# Patient Record
Sex: Male | Born: 1985 | Race: White | Hispanic: No | Marital: Single | Smoking: Current every day smoker
Health system: Southern US, Community
[De-identification: ages and names within clinical notes are randomized; demographics above are authoritative.]

## PROBLEM LIST (undated history)

## (undated) DIAGNOSIS — J45909 Unspecified asthma, uncomplicated: Secondary | ICD-10-CM

## (undated) DIAGNOSIS — F419 Anxiety disorder, unspecified: Secondary | ICD-10-CM

## (undated) DIAGNOSIS — J449 Chronic obstructive pulmonary disease, unspecified: Secondary | ICD-10-CM

## (undated) HISTORY — PX: APPENDECTOMY: SHX54

## (undated) HISTORY — PX: TONSILLECTOMY: SUR1361

## (undated) HISTORY — PX: FEMUR FRACTURE SURGERY: SHX633

---
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
## 2005-05-14 ENCOUNTER — Emergency Department: Payer: Self-pay | Admitting: Emergency Medicine

## 2005-06-25 ENCOUNTER — Emergency Department: Payer: Self-pay | Admitting: Emergency Medicine

## 2006-02-07 ENCOUNTER — Emergency Department (HOSPITAL_COMMUNITY): Admission: EM | Admit: 2006-02-07 | Discharge: 2006-02-08 | Payer: Self-pay | Admitting: Emergency Medicine

## 2006-05-13 ENCOUNTER — Emergency Department (HOSPITAL_COMMUNITY): Admission: EM | Admit: 2006-05-13 | Discharge: 2006-05-13 | Payer: Self-pay | Admitting: Emergency Medicine

## 2006-06-28 ENCOUNTER — Emergency Department: Payer: Self-pay | Admitting: Emergency Medicine

## 2006-07-17 ENCOUNTER — Emergency Department: Payer: Self-pay | Admitting: Unknown Physician Specialty

## 2006-09-06 ENCOUNTER — Emergency Department (HOSPITAL_COMMUNITY): Admission: EM | Admit: 2006-09-06 | Discharge: 2006-09-06 | Payer: Self-pay | Admitting: Family Medicine

## 2007-02-08 ENCOUNTER — Emergency Department (HOSPITAL_COMMUNITY): Admission: EM | Admit: 2007-02-08 | Discharge: 2007-02-08 | Payer: Self-pay | Admitting: Emergency Medicine

## 2007-03-14 ENCOUNTER — Emergency Department (HOSPITAL_COMMUNITY): Admission: EM | Admit: 2007-03-14 | Discharge: 2007-03-15 | Payer: Self-pay | Admitting: Emergency Medicine

## 2007-11-30 ENCOUNTER — Emergency Department: Payer: Self-pay | Admitting: Emergency Medicine

## 2008-05-26 ENCOUNTER — Emergency Department (HOSPITAL_COMMUNITY): Admission: EM | Admit: 2008-05-26 | Discharge: 2008-05-26 | Payer: Self-pay | Admitting: Emergency Medicine

## 2009-01-10 ENCOUNTER — Emergency Department: Payer: Self-pay | Admitting: Emergency Medicine

## 2009-02-16 ENCOUNTER — Emergency Department (HOSPITAL_COMMUNITY): Admission: EM | Admit: 2009-02-16 | Discharge: 2009-02-17 | Payer: Self-pay | Admitting: Emergency Medicine

## 2009-12-21 ENCOUNTER — Emergency Department (HOSPITAL_COMMUNITY): Admission: EM | Admit: 2009-12-21 | Discharge: 2009-12-21 | Payer: Self-pay | Admitting: Emergency Medicine

## 2010-04-22 ENCOUNTER — Emergency Department (HOSPITAL_COMMUNITY): Admission: EM | Admit: 2010-04-22 | Discharge: 2010-04-22 | Payer: Self-pay | Admitting: Family Medicine

## 2010-09-02 ENCOUNTER — Other Ambulatory Visit (HOSPITAL_COMMUNITY): Payer: Self-pay | Admitting: Family Medicine

## 2010-09-02 ENCOUNTER — Encounter (INDEPENDENT_AMBULATORY_CARE_PROVIDER_SITE_OTHER): Payer: Self-pay | Admitting: Family Medicine

## 2010-09-02 DIAGNOSIS — R52 Pain, unspecified: Secondary | ICD-10-CM

## 2010-09-02 DIAGNOSIS — R609 Edema, unspecified: Secondary | ICD-10-CM

## 2010-09-02 LAB — CONVERTED CEMR LAB
AST: 33 units/L (ref 0–37)
Alkaline Phosphatase: 69 units/L (ref 39–117)
BUN: 9 mg/dL (ref 6–23)
Calcium: 10 mg/dL (ref 8.4–10.5)
Chloride: 102 meq/L (ref 96–112)
Creatinine, Ser: 0.74 mg/dL (ref 0.40–1.50)
TSH: 1.726 microintl units/mL (ref 0.350–4.500)
Total Bilirubin: 0.6 mg/dL (ref 0.3–1.2)

## 2010-09-03 ENCOUNTER — Other Ambulatory Visit (HOSPITAL_COMMUNITY): Payer: Self-pay

## 2010-09-30 ENCOUNTER — Other Ambulatory Visit (HOSPITAL_COMMUNITY): Payer: Self-pay

## 2010-10-13 LAB — URINALYSIS, ROUTINE W REFLEX MICROSCOPIC
Ketones, ur: NEGATIVE mg/dL
Nitrite: NEGATIVE
pH: 6 (ref 5.0–8.0)

## 2010-12-12 NOTE — Op Note (Signed)
[place]. [facility]  Patient:    Roy Wilson, Roy Wilson                      MRN: [id number] Proc. Date: 02/28/01 Adm. Date:  [id number] Attending:  Carlena Sax CC:         Elvina Sidle, M.D.  Caryl Comes. Slotnick, M.D.   Operative Report  PREOPERATIVE DIAGNOSIS:  Obstructive sleep apnea with tonsillar hypertrophy.  POSTOPERATIVE DIAGNOSIS:  Obstructive sleep apnea with tonsillar hypertrophy.  OPERATION PERFORMED:  Tonsillectomy using Harmonic scalpel.  SURGEON:  Veverly Fells. Arletha Grippe, M.D.  ANESTHESIA:  General endotracheal.  INDICATIONS FOR PROCEDURE:  The patient is a 25 year old white male who has a long history of loud snoring at night and documented breath holding episodes and daytime somnolence.  Physical examination did show 3+ enlarged tonsils symmetrically enlarged.  Inpatient polysomnogram to rule out obstructive sleep apnea obtained on January 19, 2001 did reveal mild obstructive sleep apnea with an RDI of 16 events per hour desaturations down to 83%.  Based on his history and physical and findings on the sleep test, I have recommended proceeding with the above noted surgical procedure.  I have discussed extensively with him and his family the risks and benefits of the surgery including risks of general anesthesia, infection, bleeding, normal recovery period to be expected after this type of surgery.  I have entertained any questions and answered them appropriately.  Informed consent has been obtained and the patient presents for the above noted procedure.  OPERATIVE FINDINGS:  Bilaterally enlarged tonsils.  DESCRIPTION OF PROCEDURE:  The patient was brought to the operating room and placed in supine position.  General endotracheal anesthesia was administered via the anesthesiologist without complications.  The patient was administered 1 gm of Ancef IV x 1 and 60 mg of Decadron IV x 1.  The head of the table was turned 90 degrees.  The  patients face was draped in standard fashion.  The Crowe-Davis mouth retractor was inserted into the oral cavity.  This was used to retract the mouth open.  First attention was turned to the right tonsil.  A curved Allis clamp was used to grasp the tonsil and retract it medially. Harmonic scalpel was then used to dissect the tonsil free from the tonsillar fossa.  Bleeding was controlled with a combination of harmonic scalpel and suction cautery without difficulty.  Tonsil was then transected at the tongue base, removed through the oral cavity without incident.  Next, attention was turned to the left tonsil.  Identical procedure was carried out on this side compared to the right side with identical results.  After this was done, bleeding from both tonsillar fossas controlled meticulously with suction cautery without difficulty.  Both tonsillar fossas were then irrigated and suctioned dry.  There was no evidence of any active bleeding.  A red rubber catheter was placed through the left nares and brought out through the oral cavity.  This was used to retract the soft palate.  Indirect mirror examination of the nasopharynx did not show any significant adenoid hypertrophy.  Therefore an adenoidectomy was not performed.  The red rubber catheter was released through the nasal chamber and removed without difficulty.  An orogastric tube was placed.  This was used to decompress the stomach contents.  It was then removed without incident.  A total of 3 cc of a 0.5% Marcaine solution with 1:200,000 epinephrine was infiltrated into the anterior tonsillar pillar and tongue base bilaterally.  The Crowe-Davis mouth retractor was released and brought out through the oral cavity without incident.  FLUID GIVEN DURING PROCEDURE:  Approximately 800 cc of crystalloid.  ESTIMATED BLOOD LOSS:  Less than 30 cc.  URINE OUTPUT:  Not measured.  There were no drains, no  packs.  SPECIMENS:  Tonsils x 2.  The  patient tolerated the procedure well without complications, was extubated in the operating room and transferred to recovery in stable condition. Sponge, needle and instrument counts were correct at the end of the procedure. Total duration of the procedure was approximately one hour.  The patient will be admitted for overnight recovery.  Once he has recovered well, he will be sent home on  February 28, 2001.  He will be sent home on amoxicillin elixir 250 mg p.o. t.i.d. for 10 days, Lortab elixir 250 cc with two refills one to three teaspoons p.o. q.4h. p.r.n. pain.  He is to have light activity, post tonsillectomy diet for two weeks after surgery.  Both he and his family were given oral and written instructions.  They are to call for any problems with bleeding, fever, vomiting, pain, reaction to medications or any other questions.  He will follow up in the office for postoperative check Tuesday August 27 at 4 oclock p.m. DD:  02/28/01 TD:  02/28/01 Job: [id number]

## 2010-12-12 NOTE — Op Note (Signed)
[place]. [facility]  Patient:    Roy Wilson, Roy Wilson                      MRN: [id number] Adm. Date:  [id number] Attending:  Merrie Roof                           Operative Report  PREOPERATIVE DIAGNOSES: 1. Left postoperative tonsillectomy hemorrhage. 2. Psychogenic seizure disorder.  POSTOPERATIVE DIAGNOSES: 1. Left postoperative tonsillectomy hemorrhage. 2. Psychogenic seizure disorder.  PROCEDURE:  Electrocautery control of left fossa postoperative tonsillectomy hemorrhage.  SURGEON:  Carolan Shiver, M.D.  ANESTHESIA:  General endotracheal, Edwin Cap. Zoila Shutter, M.D.  COMPLICATIONS:  None.  INDICATIONS FOR PROCEDURE:  Roy Wilson is a 25 year old white male who originally underwent a tonsillectomy approximately one week ago by Dr. Carlena Sax.  Postoperatively the patient had a psychogenic seizure.  He was well until the evening of March 07, 2001, when at approximately 10 p.m. he spontaneously hemorrhaged from his left tonsillar fossa.  His mother called me from their car as they were en route to [facility] Emergency Room.  He was met in the emergency room and evaluated by myself.  He was found to have a large clot filling the left tonsillar fossa, and he was spitting out bright red blood. He had stable vital signs, and there was no bleeding from the right side.  He was awake, alert, and his mother was present in the emergency room.  He was diagnosed as having a spontaneous left postop tonsillectomy hemorrhage and was recommended for transfer immediately to the operating room for electrocautery control of the postoperative tonsillectomy hemorrhage.  Risks and complications of the procedure were explained to the mother, questions were invited and answered, and informed consent was signed.  JUSTIFICATION FOR PROCEDURE:  Patients age and need for general endotracheal anesthesia.  JUSTIFICATION FOR OVERNIGHT STAY: 1. Twenty-three hours of  observation to rule out further postoperative    tonsillectomy hemorrhage. 2. Patient has a history of psychogenic seizures.  DESCRIPTION OF PROCEDURE:  After the patient was taken to the operating room, he was placed in a supine position.  An IV had been begun in the emergency room.  A crash induction-type procedure was performed by Dr. Zoila Shutter with cricoid pressure.  The patient had eaten one bite of bread and drunk a half cup of tea at 10 p.m., approximately 1 hour 10 minutes prior to the induction. The induction was quite smooth.  The patient was intubated without difficulty or any aspiration.  The endotracheal cuff immediately inflated.  Throat pack was placed.  The patient was turned 90 degrees and placed in the Rose position, head drapes applied, and the Crowe-Davis mouth gag was inserted, followed by the moistened throat pack as previously mentioned.  Examination of his oropharynx revealed a large clot filling the left tonsillar fossa.  The clot was removed, and the patient was found to be bleeding from two large arteries in the left superior pole.  The bleeding was controlled with suction cautery.  The remaining portion of the fossa was lightly cauterized, as it was oozing blood from multiple islands of granulation tissue.  Attention was then turned to the right tonsillar fossa, where there were several islands of granulation tissue which were oozing a small amount of blood.  These were lightly cauterized again with suction cautery.  A red rubber catheter was then placed through the right  naris and used as a soft palate retractor. Examination of the nasopharynx showed a small amount of adenoid tissue.  There was no bleeding.  The throat pack was removed, and a #10 gauge sump NG tube was inserted into the stomach, and gastric contents were evacuated.  Only a small amount of coffee-grounds gastric contents was removed.  The patient did not have a large volume of his blood in his  stomach as previously anticipated. The patient was then awakened, extubated, and transferred to his hospital bed. He appeared to tolerate both the general endotracheal anesthesia and the procedures well and left the operating room in stable condition.  Total fluids:  200 cc.  Estimated blood loss less than 50 cc.  Sponge, needle, and cotton ball counts were correct at the termination of the procedure.  The patient received 10 mg of Decadron IV.  Roy Wilson will be admitted to [facility] for overnight observation.  If stable, he will be discharged on March 09, 2001, with his mother, who will be instructed to keep his follow-up appointment with Dr. Arletha Grippe. DD:  03/08/01 TD:  03/08/01 Job: [id number]
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
## 2010-12-12 NOTE — Discharge Summary (Signed)
[place]. [facility]  Patient:    Roy Wilson, Roy Wilson                      MRN: [id number] Adm. Date:  [id number] Disc. Date: [id number] Attending:  Doneta Public Dictator:   Harrold Donath, M.D. CC:         Deanna Artis. Sharene Skeans, M.D.  Kathyrn P. Lindie Spruce, Ph.D.  Elvina Sidle, M.D.  Dr. _____  Frutoso Schatz   Discharge Summary  CONSULTATIONS: 1. Dr. Sharene Skeans. 2. Dr. Lindie Spruce.  DISCHARGE DIAGNOSES: 1. Hysterical conversion disorder. 2. Attention-deficit hyperactivity disorder. 3. Bipolar disorder.  DISCHARGE MEDICATIONS: 1. Neurontin 800 mg b.i.d. 2. Zoloft 50 mg b.i.d. 3. Concerta 36 mg q.d. 4. Risperdal 1 mg b.i.d. 5. Trazodone 100 mg q.h.s. 6. Advair 1 puff b.i.d. 7. Albuterol MDI 2 puffs q.4-6h. p.r.n. 8. Ammonia smelling salts to be used p.r.n. for seizure recovery.  PROCEDURES: 1. EEG was done on March 01, 2001, which showed normal findings. 2. EEG was also done on February 28, 2001, which showed normal findings.  HISTORY OF PRESENT ILLNESS:  The patient is a 25 year old white boy who presented to the emergency room after having a five-hour-long seizure with loss of consciousness postoperatively after a T&A.  HOSPITAL COURSE: #1 - HYSTERICAL CONVERSION DISORDER/SEIZURE DISORDER:  The patients mom states that the patient has had four episodes of seizure disorder in which he has rhythmic and sporadic movements of the upper and lower extremities.  Mom states that he has loss of consciousness during each event.  She states the one which brought the patient to the emergency department lasted five hours and began after anesthesia wore off after his surgical procedure.  In the ED, Dr. Thad Ranger was consulted and evaluated the patient and, at that time, EEG was negative.  The patient is being followed by Dr. Sharene Skeans, who has not diagnosed him with any seizure disorder due to the fact that he believes most of this is psychogenic.  A  repeat EEG was done on March 01, 2001, which showed only normal findings.  The patient had another small "seizure" after being admitted to the pediatric floor, and the patient was not aroused with sternal rub or pressure on the nail beds; however, he did recover quickly after ammonia smelling salts were placed under his nose.  Since the patient has an extensive psychiatric history including ADHD, bipolar disorder, and PTSD, it is believed that the seizure could be actually psychogenic in nature. Therefore, Dr. Sharene Skeans believes this is a conversion disorder.  All laboratory workup was negative.  Dr. Lindie Spruce was consulted to further evaluate the patient.  She has set up an appointment with Dr. ______ , his psychiatrist.  She has also been in contact with Frutoso Schatz, the patients service coordinator, who has extensive contact with the patient and will be making a home visit to see him.  DISPOSITION/CONDITION ON DISCHARGE:  The patient was discharged to home with his mom in stable condition.  DISCHARGE INSTRUCTIONS:  The patient and his mother were informed of his follow-up appointment with Dr. ______ on March 11, 2001, at 2:30 p.m. and also that Frutoso Schatz will be visiting them at home.  They were also told to use Chloraseptic spray for a sore throat after T&A and to continue soft foods until a normal diet is tolerated.  Mom was also informed on how to use the smelling salts when needed. DD:  03/02/01 TD:  03/04/01 Job: [id number]  [id number]

## 2010-12-12 NOTE — H&P (Signed)
[place]. [facility]  Patient:    Roy Wilson, Roy Wilson                      MRN: [id number] Adm. Date:  [id number] Attending:  Doneta Public Dictator:   Harrold Donath, M.D.                         History and Physical  CHIEF COMPLAINT:  Seizure postoperatively.  HISTORY OF PRESENT ILLNESS:  Patient is a 25 year old white male who presents with a questionable seizure disorder, bipolar disease, ADHD, a post traumatic stress disorder, and asthma.  He had a tonsillectomy today and following recovery from anesthesia began having sporadic and rhythmic upper and lower extremity movements, and was taken to the emergency department.  According to his mother, the patient was seizing for approximately five hours and during that time he had loss of consciousness.  The patient has amnesia of the event. In the ED, Ativan, Valium, and Dilantin were given the patient with no resolution of symptoms.  Haldol was then given which caused the patient to become responsive.  In the ED, Dr. Thad Ranger evaluated the patient and an EEG at that time during seizing was negative.  The patient is followed by Dr. Sharene Skeans.  He has also undergone a sleep study in the recent past and was noted to have mild obstructive sleep apnea.  PAST MEDICAL HISTORY:  Asthma, ADHD, bipolar disease, post traumatic stress syndrome, and now, tonsillectomy.  PAST SURGICAL HISTORY:  Femur fracture in 1985.  MEDICATIONS: 1. Concerta 36 mg b.i.d. 2. Trazodone 100 mg q.h.s. 3. Advair Diskus. 4. Ventolin. 5. Risperdal 1 mg b.i.d. 6. Neurontin 800 mg b.i.d. 7. Zoloft 50 mg b.i.d.  SOCIAL HISTORY:  He lives with his mother, father, grandfather, brother, and cousin.  He has a history of smoking tobacco but currently does not.  No alcohol or other illicit drug use.  He has been on probation in the past for stealing cigarettes.  He is currently in the eighth grade and may or may not graduate into high  school.  [place].  FAMILY HISTORY:  Significant for bipolar disease, ADHD, dysthymia, depression, asthma, CAD, diabetes, hypertension.  There is no evidence of seizure disorder in the family.  REVIEW OF SYSTEMS:  No fevers or chills.  No nausea or vomiting.  No chest pain or shortness of breath.  He has a history of three previous seizures which began in May 2002.  PHYSICAL EXAMINATION:  VITAL SIGNS:  Temperature 99.8, blood pressure 146/74, heart rate 93, respirations 24, saturation 98% on room air.  GENERAL:  He was somnolent but alert.  HEENT:  Pupils were 6 mm, slightly dilated, and sluggish to respond. Oropharynx was clear without erythema or exudate.  Mucous membranes were moist and patient had good dentition.  NECK:  Full but no lymphadenopathy.  PULMONARY:  Clear to auscultation bilaterally.  No wheezes, rhonchi, or crackles.  CARDIOVASCULAR:  Regular rate and rhythm.  No murmurs, rubs, or gallops.  ABDOMEN:  Positive bowel sounds, nontender, nondistended.  No rebound or guarding.  EXTREMITIES:  No clubbing, cyanosis, or edema.  Pedal pulses 2+ bilaterally.  NEUROLOGIC:  Cranial nerves 2-12 are grossly intact.  Normal Babinski.  Normal RAMs.  Strength is +5/5.  Deep tendon reflexes 2+ in upper and lower extremities bilaterally.  LABORATORY DATA:  Significant for a glucose 147.  Tox screen is positive for benzodiazepines and barbituates but the patient was given Ativan, Valium, and Dilantin in the ED.  CK 211.  Otherwise, the renal and LFTs were normal.  ASSESSMENT AND PLAN:  A 25 year old male with significant psychiatric history and questionable movement disorder, rule out seizure disorder.  1. Psychiatric.  The seizure disorder is most likely psychogenic due to the    fact that his EEG was normal during his seizure activity.  Will continue    his home psych medications and have Dr. Jeanie Sewer evaluate him in the     morning. 2. Questionable seizure disorder.  We are considering a partial or generalized    seizure versus a toxic exposure, although currently all labs are normal.    We have ruled out hypoglycemia and hypo/hypercalcemia, phosphatemia,    kalemia.  Hyperglycemia is also pretty unlikely considering that his    glucose was only elevated to 147.  A prolactin level is pending to rule    out pseudoseizures.  It is likely that this is all psychogenic in nature    due to the fact that his EEG was negative and workup so far has been    normal. 3. Hyperglycemia.  This may be secondary to a postoperative state versus    glucose impairment.  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  We will continue to follow his glucose levels throughout his stay. 4. Disposition.  Patient will likely be discharged to home after his    psychiatric evaluation tomorrow, as long as he is in stable condition. DD:  02/28/01 TD:  03/01/01 Job: [id number]

## 2010-12-12 NOTE — Consult Note (Signed)
[place]  Patient:    Roy Wilson, VANDERSCHAAF                      MRN: [id number] Proc. Date: 02/28/01 Adm. Date:  [id number] Attending:  Doneta Public CC:         Dr. Deanna Artis. Hickling  Dr. Milus Glazier, pediatrician at [facility]   Consultation Report  DATE OF BIRTH:  Sep 01, 1985  REQUESTING PHYSICIAN:  Dr. Doug Sou.  CHIEF COMPLAINT:  Suspected seizures.  HISTORY OF PRESENT ILLNESS:  This is an inpatient consultation evaluation of this existing [facility] patient, a 25 year old boy with a medical history remarkable for bipolar disorder, ADHD, and other psychiatric comorbidities as well as asthma, who was seen by Dr. Sharene Skeans on January 26, 2001 for an episode of "passing out."  It could not be determined at that time whether this was syncope or seizure.  The patient was in the day surgery unit today postoperatively from a tonsillectomy.  He felt very well after the procedure per his mother from about 8:30 a.m until about 1 p.m. this evening. He received a dose of Lortab for pain and was a little bit sleepy.  His mother left the room and she said she heard something drop out of his hand.  She returned and found the patient unresponsive with twitching movements of the right arm and leg.  From that time forward the patient has had ongoing motor activity alternating between the arms and the legs, and sometimes all over, often nonrhythmic and influenced by outside forces.  He has not appeared awake or responsive all this time.  On my arrival I found the patient twitching both of his hands.  He later developed rhythmic thrashing movements of his legs and he began turning his body in various directions, ultimately landing on the right.  He received Valium 17.5 mg in the postoperative and PACU areas, along with Ativan 3 mg in the emergency room as well as two doses of Dilantin 300 mg.  On my order, he was also  given Haldol 2 mg IV and it was after this that the motor activity ceased.  PAST MEDICAL HISTORY:  Remarkable for a neuropsychiatric disorder, including bipolar disorder, ADHD, post traumatic stress disorder, and dysthymia.  He also has allergic rhinitis and asthma.  FAMILY HISTORY:  There is no known history of seizures.  Mother has hypertension and migraine headaches.  Many family members have asthma.  SOCIAL HISTORY:  There is no for a history of tobacco, alcohol, or illicit drug use.  He lives with his mother and is performing poorly in school.  REVIEW OF SYSTEMS:  No clear recent illness.  No chest pain, shortness of breath, nausea, vomiting.  MEDICATIONS: 1. Neurontin 800 mg b.i.d. 2. Risperdal 0.5 mg q.d. 3. Concerta 36 mg q.a.m. 4. Zoloft 50 mg q.a.m. 5. Trazodone. 6. Advair. 7. Ventolin.  PHYSICAL EXAMINATION:  VITAL SIGNS:  Temperature 99.8, blood pressure 146/74, pulse 126, respirations 24.  GENERAL/MENTAL STATUS:  Initially, there was active motor activity and no response to verbal stimuli.  He did have avoidance to pain stimuli and withdrew very vigorously to ammonia capsule.  Later he was lethargic but was responsive.  He was able to answer some simple questions and follow simple commands.  He could state the year but not where he was.  NECK:  Supple without bruits.  HEART:  Tachycardic with regular rhythm.  NEUROLOGIC:  Mental  status as above.  Cranial nerves:  Funduscopic exam is benign.  Pupils are large but briskly reactive.  Extraocular movements are normal.  He blinks to threat.  Face is symmetric.  Tongue and palate move in the midline.  Motor exam:  Normal bulk and tone.  He demonstrates poor effort but can maintain all his extremities against gravity.  Sensation:  He withdraws to pain in all extremities.  Reflexes are 2+ and symmetric, toes are downgoing.  Cerebellar and gait exams are deferred.  LABORATORY DATA:  CBC is unremarkable.  I-STAT is  remarkable for elevated glucose of 147.  EEG was performed during much of his major motor activity and underneath abundant muscle and movement artifact, normal rhythms are demonstrated.  IMPRESSION:  A psychogenic seizure.  I discussed this phenomenon and its implications in some detail with his mother and he seemed to understand this.  RECOMMENDATIONS: 1. Continue psychiatric medicines.  For ongoing motor activity would treat    with Haldol 1 mg q.4h. p.r.n. 2. Psychiatric evaluation and evaluation with Dr. Sharene Skeans in the morning. 3. Check prolactin level tonight.  Thank you for the consult. DD:  02/28/01 TD:  03/01/01 Job: [id number]

## 2010-12-12 NOTE — Discharge Summary (Signed)
NAME:  Roy Wilson, Roy Wilson                         ACCOUNT NO.:  [id number]   MEDICAL RECORD NO.:  [id number]                   PATIENT TYPE:  INP   LOCATION:  0203                                 FACILITY:  [facility]   PHYSICIAN:  Cindie Crumbly, M.D.               DATE OF BIRTH:  26-Feb-1986   DATE OF ADMISSION:  05/15/2002  DATE OF DISCHARGE:  05/22/2002                                 DISCHARGE SUMMARY   REASON FOR ADMISSION:  This 25 year old white male was admitted for  inpatient psychiatric stabilization because of increasing symptoms of  depression with suicidal ideation with a plan to stab himself.  For further  history of present illness, please see the patient's psychiatric admission  assessment.   PHYSICAL EXAMINATION:  Physical examination at the time of admission was  significant only for a history of asthma and a past history of  pseudoseizures, none of which were observed during the course of this  hospitalization.   LABORATORY DATA:  The patient underwent a laboratory workup to rule out any  other medical problems contributing to his symptomatology.  Urine probe for  gonorrhea and Chlamydia were negative.  CBC was unremarkable.  Routine  chemistry panel was within normal limits.  GGT was within normal limits.  Hepatic panel was within normal limits.  TSH and free T4 were within normal  limits.  Urine drug screen was negative.  UA was unremarkable.  RPR was  nonreactive.  The patient received no x-rays, no special procedures, no  additional consultations.  He sustained no complications during the course  of this hospitalization.   HOSPITAL COURSE:  On admission, the patient was psychomotor agitated.  Affect and mood were depressed, irritable, and angry.  He was taken off of  his admission medications by Laney Pastor. Alnaquib, M.D., who evaluated him, and  initially placed on a trial of Zoloft, Concerta, and Seroquel.  He reported  that he did not do as well on Seroquel and  complained of excessive sedation.  He was replaced back on trazodone for sleep and did well with this medicine.  He, at the time of discharge, denies any homicidal or suicidal ideation, his  affect and mood have improved, he is actively participating in all aspects  of the therapeutic treatment program and no longer appears to be danger to  himself or others.  Consequently, it is felt he has reached his maximum  benefits of hospitalization and is ready for discharge to a less restrictive  alternative setting.   CONDITION ON DISCHARGE:  His condition on discharge is improved.   DIAGNOSES:  Diagnoses according to DSM-IV:   AXIS I:  1. Posttraumatic stress disorder.  2. Major depression, recurrent, severe without psychosis.  3. Rule out attention-deficit hyperactivity disorder.  4. Rule out intermittent explosive disorder.  5. Oppositional defiant disorder.  6. Rule out conduct disorder.   AXIS II:  Rule out personality  disorder, not otherwise specified.   AXIS III:  Asthma.   AXIS IV:  Current psychosocial stressors are severe.   AXIS V:  20 on admission, 30 on discharge.   FURTHER EVALUATION AND TREATMENT RECOMMENDATIONS:  1. The patient is discharged to home.  2. He is discharged on an unrestricted level of activity and a regular diet.  3. He will follow up with his outpatient psychiatrist at [facility] for all further aspects of his psychiatric care and     consequently, I will sign off on the case at this time.   DISCHARGE MEDICATIONS:  1. Zoloft 100 mg p.o. q.d.  2. Trazodone 100 mg p.o. q.h.s.  3. Concerta 54 mg p.o. q.a.m.  4. Singulair 10 mg p.o. q.d. as prescribed by his primary care physician.  5. Albuterol inhaler two puffs q.4h. p.r.n. for wheezing or as prescribed by     his primary care physician.                                                Cindie Crumbly, M.D.    TS/MEDQ  D:  05/22/2002  T:  05/22/2002  Job:  [id number]

## 2011-05-08 LAB — I-STAT 8, (EC8 V) (CONVERTED LAB)
BUN: 8
Bicarbonate: 28 — ABNORMAL HIGH
Glucose, Bld: 91
HCT: 45
Operator id: 270651
pCO2, Ven: 45.2

## 2011-05-08 LAB — TYPE AND SCREEN: Antibody Screen: NEGATIVE

## 2011-05-08 LAB — ABO/RH: ABO/RH(D): A POS

## 2011-05-11 LAB — CBC
Hemoglobin: 15.6
MCHC: 34.6
MCV: 86.3
RBC: 5.24
RDW: 13.8

## 2011-05-11 LAB — DIFFERENTIAL
Basophils Absolute: 0
Basophils Relative: 0
Eosinophils Absolute: 0.1
Monocytes Absolute: 0.6
Monocytes Relative: 7
Neutro Abs: 5.1
Neutrophils Relative %: 52

## 2011-05-11 LAB — ETHANOL: Alcohol, Ethyl (B): <5

## 2011-05-11 LAB — BASIC METABOLIC PANEL
CO2: 28
Calcium: 9.7
Chloride: 104
Creatinine, Ser: 0.75
Glucose, Bld: 96

## 2011-11-06 ENCOUNTER — Emergency Department (HOSPITAL_COMMUNITY)
Admission: EM | Admit: 2011-11-06 | Discharge: 2011-11-06 | Disposition: A | Discharge status: Home / Self Care | Payer: Self-pay | Attending: Emergency Medicine | Admitting: Emergency Medicine

## 2011-11-06 ENCOUNTER — Encounter (HOSPITAL_COMMUNITY): Payer: Self-pay | Admitting: Emergency Medicine

## 2011-11-06 ENCOUNTER — Emergency Department (HOSPITAL_COMMUNITY): Payer: Self-pay

## 2011-11-06 DIAGNOSIS — Z7982 Long term (current) use of aspirin: Secondary | ICD-10-CM | POA: Insufficient documentation

## 2011-11-06 DIAGNOSIS — F17200 Nicotine dependence, unspecified, uncomplicated: Secondary | ICD-10-CM | POA: Insufficient documentation

## 2011-11-06 DIAGNOSIS — R109 Unspecified abdominal pain: Secondary | ICD-10-CM | POA: Insufficient documentation

## 2011-11-06 DIAGNOSIS — M545 Low back pain, unspecified: Secondary | ICD-10-CM | POA: Insufficient documentation

## 2011-11-06 DIAGNOSIS — R079 Chest pain, unspecified: Secondary | ICD-10-CM | POA: Insufficient documentation

## 2011-11-06 DIAGNOSIS — R0602 Shortness of breath: Secondary | ICD-10-CM | POA: Insufficient documentation

## 2011-11-06 DIAGNOSIS — S335XXA Sprain of ligaments of lumbar spine, initial encounter: Secondary | ICD-10-CM | POA: Insufficient documentation

## 2011-11-06 DIAGNOSIS — S39012A Strain of muscle, fascia and tendon of lower back, initial encounter: Secondary | ICD-10-CM

## 2011-11-06 LAB — POCT I-STAT, CHEM 8
Calcium, Ion: 1.24 mmol/L (ref 1.12–1.32)
Creatinine, Ser: 0.8 mg/dL (ref 0.50–1.35)
Glucose, Bld: 106 mg/dL — ABNORMAL HIGH (ref 70–99)
HCT: 54 % — ABNORMAL HIGH (ref 39.0–52.0)
Hemoglobin: 18.4 g/dL — ABNORMAL HIGH (ref 13.0–17.0)
TCO2: 26 mmol/L (ref 0–100)

## 2011-11-06 LAB — URINALYSIS, ROUTINE W REFLEX MICROSCOPIC
Bilirubin Urine: NEGATIVE
Hgb urine dipstick: NEGATIVE
Nitrite: NEGATIVE
Protein, ur: NEGATIVE mg/dL
Specific Gravity, Urine: 1.011 (ref 1.005–1.030)
Urobilinogen, UA: 0.2 mg/dL (ref 0.0–1.0)

## 2011-11-06 MED ORDER — ONDANSETRON HCL 4 MG/2ML IJ SOLN
4.0000 mg | Freq: Once | INTRAMUSCULAR | Status: AC
  Administered 2011-11-06: 4 mg via INTRAVENOUS
  Filled 2011-11-06: qty 2

## 2011-11-06 MED ORDER — KETOROLAC TROMETHAMINE 30 MG/ML IJ SOLN
30.0000 mg | Freq: Once | INTRAMUSCULAR | Status: AC
  Administered 2011-11-06: 30 mg via INTRAVENOUS
  Filled 2011-11-06: qty 1

## 2011-11-06 MED ORDER — IBUPROFEN 800 MG PO TABS: 800.0000 mg | ORAL_TABLET | Freq: Three times a day (TID) | ORAL | Status: AC | PRN

## 2011-11-06 MED ORDER — HYDROMORPHONE HCL PF 1 MG/ML IJ SOLN
1.0000 mg | Freq: Once | INTRAMUSCULAR | Status: AC
  Administered 2011-11-06: 1 mg via INTRAVENOUS
  Filled 2011-11-06: qty 1

## 2011-11-06 MED ORDER — OXYCODONE-ACETAMINOPHEN 5-325 MG PO TABS: 2.0000 | ORAL_TABLET | ORAL | Status: AC | PRN

## 2011-11-06 NOTE — ED Notes (Signed)
Pt complains of right flank pain and a pulled muscle on his right side from 4 days prior. Firefighters got there with an initial BP of 172/98. EMS retook his BP at 0822 and got 148/84, HR of 68, spO2 of 94, and CBG of 103.  EMS administered O2 and he said he initially felt better. During the transfer he said he felt chest tightness and tingly. He also said he wanted a note for his court date today. Pt is a smoker with no medical histories or medications and NKA. Wife has a learning disability.

## 2011-11-06 NOTE — ED Notes (Signed)
Pt complaining of thirst and given a can of ginger ale.

## 2011-11-06 NOTE — ED Provider Notes (Signed)
History     CSN: [id number]  Arrival date & time 11/06/11  7829   First MD Initiated Contact with Patient 11/06/11 [phone]      Chief Complaint  Patient presents with  . Flank Pain    (Consider location/radiation/quality/duration/timing/severity/associated sxs/prior treatment) HPI Comments: The patient presents for evaluation of 2 complaints. 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 There was no onset of pain during the physical labor, but rather the next day. He reports frequency of urination but denies dysuria or hematuria, denies nausea or vomiting. He denies any known injury and denies any similar symptoms in the past.  When I ask him why this morning he decided to get the back and flank pain evaluated, specifying my question to "what had changed her what was different prompting her to come in to the emergency department today as opposed to yesterday?"  The patient then tells me that the reason he called EMS was because this morning he woke up feeling short of breath, with reported perception of palpitations, sweaty, with vague chest "tightness" but without wheezing or coughing. He said these symptoms lasted less than 10 minutes and resolved completely and spontaneously and have not returned. At present, the patient is in no apparent distress and breathing easily. His ECG shows a normal sinus rhythm without signs of ischemia, infarction, or conduction abnormality.  Patient is a 26 y.o. male presenting with flank pain and shortness of breath. The history is provided by the patient.  Flank Pain This is a new problem. The current episode started more than 2 days ago (4 days ago). The problem occurs constantly. The problem has been  gradually worsening. Associated symptoms include chest pain and shortness of breath. Pertinent negatives include no abdominal pain and no headaches. The symptoms are aggravated by nothing. The symptoms are relieved by nothing. He has tried nothing for the symptoms.  Shortness of Breath  The current episode started today. The onset was sudden. Episode frequency: Once. The problem has been resolved. The problem is moderate. The symptoms are relieved by nothing. The symptoms are aggravated by nothing. Associated symptoms include chest pain and shortness of breath. Pertinent negatives include no chest pressure, no orthopnea, no fever, no rhinorrhea, no sore throat, no stridor, no cough and no wheezing. There was no intake of a foreign body. He has had no prior steroid use. He has had no prior hospitalizations. He has had no prior ICU admissions. He has had no prior intubations. His past medical history does not include asthma. He has been behaving normally. Urine output has been normal. There were no sick contacts. He has received no recent medical care.    History reviewed. No pertinent past medical history.  Past Surgical History  Procedure Date  . Femur fracture surgery as child    History reviewed. No pertinent family history.  History  Substance Use Topics  . Smoking status: Current Everyday Smoker -- 0.5 packs/day for 7 years    Types: Cigarettes  . Smokeless tobacco: Former Neurosurgeon    Quit date: 11/06/2010  . Alcohol Use: No     last drink was 3 months prior      Review of Systems  Constitutional: Negative for fever.  HENT: Negative for sore throat and  rhinorrhea.   Respiratory: Positive for shortness of breath. Negative for cough, wheezing and stridor.   Cardiovascular: Positive for chest pain. Negative for orthopnea.  Gastrointestinal: Negative for abdominal pain.  Genitourinary: Positive for flank pain.  Neurological: Negative for headaches.  All other systems reviewed and are  negative.    Allergies  Review of patient's allergies indicates no known allergies.  Home Medications   Current Outpatient Rx  Name Route Sig Dispense Refill  . ASPIRIN 325 MG PO TABS Oral Take 325 mg by mouth daily as needed. For chest pain      BP 124/93  Pulse 85  Temp(Src) 97.6 F (36.4 C) (Oral)  Resp 20  SpO2 99%  Physical Exam  Nursing note and vitals reviewed. Constitutional: He is oriented to person, place, and time. He appears well-nourished. No distress.  HENT:  Head: Normocephalic and atraumatic.  Eyes: EOM are normal. Pupils are equal, round, and reactive to light.  Neck: Normal range of motion. Neck supple. No JVD present.  Cardiovascular: Normal rate, regular rhythm, normal heart sounds and intact distal pulses.  Exam reveals no gallop and no friction rub.   No murmur heard. Pulmonary/Chest: Effort normal and breath sounds normal. No respiratory distress. He has no wheezes. He has no rales. He exhibits no tenderness.  Abdominal: Soft. Bowel sounds are normal. He exhibits no distension, no fluid wave, no ascites and no mass. There is no hepatosplenomegaly. There is no tenderness. There is CVA tenderness. There is no rigidity, no rebound, no guarding, no tenderness at McBurney's point and negative Murphy's sign.       Right CVA tenderness  Musculoskeletal: Normal range of motion. He exhibits no edema and no tenderness.       No lower extremity edema, calf pain, or palpable cord at the posterior calf to suggest DVT.  Neurological: He is alert and oriented to person, place, and time. He has normal reflexes. No cranial nerve deficit. He exhibits normal muscle tone. Coordination normal.  Skin: Skin is warm and dry. No rash noted. He is not diaphoretic. No erythema. No pallor.  Psychiatric: He has a normal mood and affect. His behavior is normal. Judgment and thought content normal.    ED Course  Procedures (including critical care time)   Date: 11/06/2011  Rate:  64  Rhythm: normal sinus rhythm  QRS Axis: normal  Intervals: normal  ST/T Wave abnormalities: normal  Conduction Disutrbances: none  Narrative Interpretation: unremarkable      Labs Reviewed  URINALYSIS, ROUTINE W REFLEX MICROSCOPIC   No results found.   No diagnosis found.    MDM  As for the patient's back and flank pain, the possible etiologies that I am entertaining principally include kidney stone and renal colic, urinary tract infection, or musculoskeletal back pain from muscular strain. The patient has no midline tenderness to the cervical thoracic or lumbar spine and has no known injury to suggest need for spine imaging. I am not going to order a CT scan and less [place] altered renal function and/or suggestion of hematuria to make me further suspect kidney stone. The patient has no history of kidney stones. Otherwise, his back pain is likely musculoskeletal from strain.  As for his symptoms that he awoke with this morning of dyspnea and palpitations, his EKG is normal at this time without any signs of ischemia, infarction, or conduction abnormality. I will assess his hemoglobin and hematocrit to assure there is no anemia, assess his electrolytes, and obtain a chest  x-ray to evaluate his lung parenchyma, but I am not suspecting pulmonary embolism as he has no signs or symptoms of DVT, no history of DVT, and he is PERC negative.  I do not suspect myocardial infarction as his symptoms would be very atypical, starting at rest, and with such vague chest discomfort, normal EKG, and essentially minimal to no risk factors for coronary artery disease at his young age. The patient does note that he was due to be in court today and it is possible that his symptoms on awakening this morning could then do to anxiety or panic, could be fabricated, that at this time there is not suggestion that there are other serious etiology.        Felisa Bonier, MD 11/06/11 [phone]

## 2011-11-06 NOTE — ED Notes (Signed)
MD at bedside. 

## 2011-11-06 NOTE — Discharge Instructions (Signed)
Back Pain, Adult Low back pain is very common. About 1 in 5 people have back pain.The cause of low back pain is rarely dangerous. The pain often gets better over time.About half of people with a sudden onset of back pain feel better in just 2 weeks. About 8 in 10 people feel better by 6 weeks.  CAUSES Some common causes of back pain include:  Strain of the muscles or ligaments supporting the spine.   Wear and tear (degeneration) of the spinal discs.   Arthritis.   Direct injury to the back.  DIAGNOSIS Most of the time, the direct cause of low back pain is not known.However, back pain can be treated effectively even when the exact cause of the pain is unknown.Answering your caregiver's questions about your overall health and symptoms is one of the most accurate ways to make sure the cause of your pain is not dangerous. If your caregiver needs more information, he or she may order lab work or imaging tests (X-rays or MRIs).However, even if imaging tests show changes in your back, this usually does not require surgery. HOME CARE INSTRUCTIONS For many people, back pain returns.Since low back pain is rarely dangerous, it is often a condition that people can learn to manageon their own.   Remain active. It is stressful on the back to sit or stand in one place. Do not sit, drive, or stand in one place for more than 30 minutes at a time. Take short walks on level surfaces as soon as pain allows.Try to increase the length of time you walk each day.   Do not stay in bed.Resting more than 1 or 2 days can delay your recovery.   Do not avoid exercise or work.Your body is made to move.It is not dangerous to be active, even though your back may hurt.Your back will likely heal faster if you return to being active before your pain is gone.   Pay attention to your body when you bend and lift. Many people have less discomfortwhen lifting if they bend their knees, keep the load close to their  bodies,and avoid twisting. Often, the most comfortable positions are those that put less stress on your recovering back.   Find a comfortable position to sleep. Use a firm mattress and lie on your side with your knees slightly bent. If you lie on your back, put a pillow under your knees.   Only take over-the-counter or prescription medicines as directed by your caregiver. Over-the-counter medicines to reduce pain and inflammation are often the most helpful.Your caregiver may prescribe muscle relaxant drugs.These medicines help dull your pain so you can more quickly return to your normal activities and healthy exercise.   Put ice on the injured area.   Put ice in a plastic bag.   Place a towel between your skin and the bag.   Leave the ice on for 15 to 20 minutes, 3 to 4 times a day for the first 2 to 3 days. After that, ice and heat may be alternated to reduce pain and spasms.   Ask your caregiver about trying back exercises and gentle massage. This may be of some benefit.   Avoid feeling anxious or stressed.Stress increases muscle tension and can worsen back pain.It is important to recognize when you are anxious or stressed and learn ways to manage it.Exercise is a great option.  SEEK MEDICAL CARE IF:  You have pain that is not relieved with rest or medicine.   You have   pain that does not improve in 1 week.   You have new symptoms.   You are generally not feeling well.  SEEK IMMEDIATE MEDICAL CARE IF:   You have pain that radiates from your back into your legs.   You develop new bowel or bladder control problems.   You have unusual weakness or numbness in your arms or legs.   You develop nausea or vomiting.   You develop abdominal pain.   You feel faint.  Document Released: 07/13/2005 Document Revised: 07/02/2011 Document Reviewed: 12/01/2010 [facility] Patient Information 2012 [place], [place].Cryotherapy Cryotherapy means treatment with cold. Ice or gel packs can be  used to reduce both pain and swelling. Ice is the most helpful within the first 24 to 48 hours after an injury or flareup from overusing a muscle or joint. Sprains, strains, spasms, burning pain, shooting pain, and aches can all be eased with ice. Ice can also be used when recovering from surgery. Ice is effective, has very few side effects, and is safe for most people to use. PRECAUTIONS  Ice is not a safe treatment option for people with:  Raynaud's phenomenon. This is a condition affecting small blood vessels in the extremities. Exposure to cold may cause your problems to return.   Cold hypersensitivity. There are many forms of cold hypersensitivity, including:   Cold urticaria. Red, itchy hives appear on the skin when the tissues begin to warm after being iced.   Cold erythema. This is a red, itchy rash caused by exposure to cold.   Cold hemoglobinuria. Red blood cells break down when the tissues begin to warm after being iced. The hemoglobin that carry oxygen are passed into the urine because they cannot combine with blood proteins fast enough.   Numbness or altered sensitivity in the area being iced.  If you have any of the following conditions, do not use ice until you have discussed cryotherapy with your caregiver:  Heart conditions, such as arrhythmia, angina, or chronic heart disease.   High blood pressure.   Healing wounds or open skin in the area being iced.   Current infections.   Rheumatoid arthritis.   Poor circulation.   Diabetes.  Ice slows the blood flow in the region it is applied. This is beneficial when trying to stop inflamed tissues from spreading irritating chemicals to surrounding tissues. However, if you expose your skin to cold temperatures for too long or without the proper protection, you can damage your skin or nerves. Watch for signs of skin damage due to cold. HOME CARE INSTRUCTIONS Follow these tips to use ice and cold packs safely.  Place a dry or  damp towel between the ice and skin. A damp towel will cool the skin more quickly, so you may need to shorten the time that the ice is used.   For a more rapid response, add gentle compression to the ice.   Ice for no more than 10 to 20 minutes at a time. The bonier the area you are icing, the less time it will take to get the benefits of ice.   Check your skin after 5 minutes to make sure there are no signs of a poor response to cold or skin damage.   Rest 20 minutes or more in between uses.   Once your skin is numb, you can end your treatment. You can test numbness by very lightly touching your skin. The touch should be so light that you do not see the skin dimple from  the pressure of your fingertip. When using ice, most people will feel these normal sensations in this order: cold, burning, aching, and numbness.   Do not use ice on someone who cannot communicate their responses to pain, such as small children or people with dementia.  HOW TO MAKE AN ICE PACK Ice packs are the most common way to use ice therapy. Other methods include ice massage, ice baths, and cryo-sprays. Muscle creams that cause a cold, tingly feeling do not offer the same benefits that ice offers and should not be used as a substitute unless recommended by your caregiver. To make an ice pack, do one of the following:  Place crushed ice or a bag of frozen vegetables in a sealable plastic bag. Squeeze out the excess air. Place this bag inside another plastic bag. Slide the bag into a pillowcase or place a damp towel between your skin and the bag.   Mix 3 parts water with 1 part rubbing alcohol. Freeze the mixture in a sealable plastic bag. When you remove the mixture from the freezer, it will be slushy. Squeeze out the excess air. Place this bag inside another plastic bag. Slide the bag into a pillowcase or place a damp towel between your skin and the bag.  SEEK MEDICAL CARE IF:  You develop white spots on your skin. This  may give the skin a blotchy (mottled) appearance.   Your skin turns blue or pale.   Your skin becomes waxy or hard.   Your swelling gets worse.  MAKE SURE YOU:   Understand these instructions.   Will watch your condition.   Will get help right away if you are not doing well or get worse.  Document Released: 03/09/2011 Document Revised: 07/02/2011 Document Reviewed: 03/09/2011 [facility] Patient Information 2012 [place], [place].Lumbosacral Strain Lumbosacral strain is one of the most common causes of back pain. There are many causes of back pain. Most are not serious conditions. CAUSES  Your backbone (spinal column) is made up of 24 main vertebral bodies, the sacrum, and the coccyx. These are held together by muscles and tough, fibrous tissue (ligaments). Nerve roots pass through the openings between the vertebrae. A sudden move or injury to the back may cause injury to, or pressure on, these nerves. This may result in localized back pain or pain movement (radiation) into the buttocks, down the leg, and into the foot. Sharp, shooting pain from the buttock down the back of the leg (sciatica) is frequently associated with a ruptured (herniated) disk. Pain may be caused by muscle spasm alone. Your caregiver can often find the cause of your pain by the details of your symptoms and an exam. In some cases, you may need tests (such as X-rays). Your caregiver will work with you to decide if any tests are needed based on your specific exam. HOME CARE INSTRUCTIONS   Avoid an underactive lifestyle. Active exercise, as directed by your caregiver, is your greatest weapon against back pain.   Avoid hard physical activities (tennis, racquetball, waterskiing) if you are not in proper physical condition for it. This may aggravate or create problems.   If you have a back problem, avoid sports requiring sudden body movements. Swimming and walking are generally safer activities.   Maintain good posture.    Avoid becoming overweight (obese).   Use bed rest for only the most extreme, sudden (acute) episode. Your caregiver will help you determine how much bed rest is necessary.   For acute conditions, you may  put ice on the injured area.   Put ice in a plastic bag.   Place a towel between your skin and the bag.   Leave the ice on for 15 to 20 minutes at a time, every 2 hours, or as needed.   After you are improved and more active, it may help to apply heat for 30 minutes before activities.  See your caregiver if you are having pain that lasts longer than expected. Your caregiver can advise appropriate exercises or therapy if needed. With conditioning, most back problems can be avoided. SEEK IMMEDIATE MEDICAL CARE IF:   You have numbness, tingling, weakness, or problems with the use of your arms or legs.   You experience severe back pain not relieved with medicines.   There is a change in bowel or bladder control.   You have increasing pain in any area of the body, including your belly (abdomen).   You notice shortness of breath, dizziness, or feel faint.   You feel sick to your stomach (nauseous), are throwing up (vomiting), or become sweaty.   You notice discoloration of your toes or legs, or your feet get very cold.   Your back pain is getting worse.   You have a fever.  MAKE SURE YOU:   Understand these instructions.   Will watch your condition.   Will get help right away if you are not doing well or get worse.  Document Released: 04/22/2005 Document Revised: 07/02/2011 Document Reviewed: 10/12/2008 [facility] Patient Information 2012 [place], [place].Muscle Strain A muscle strain, or pulled muscle, occurs when a muscle is over-stretched. A small number of muscle fibers may also be torn. This is especially common in athletes. This happens when a sudden violent force placed on a muscle pushes it past its capacity. Usually, recovery from a pulled muscle takes 1 to 2 weeks. But  complete healing will take 5 to 6 weeks. There are millions of muscle fibers. Following injury, your body will usually return to normal quickly. HOME CARE INSTRUCTIONS   While awake, apply ice to the sore muscle for 15 to 20 minutes each hour for the first 2 days. Put ice in a plastic bag and place a towel between the bag of ice and your skin.   Do not use the pulled muscle for several days. Do not use the muscle if you have pain.   You may wrap the injured area with an elastic bandage for comfort. Be careful not to bind it too tightly. This may interfere with blood circulation.   Only take over-the-counter or prescription medicines for pain, discomfort, or fever as directed by your caregiver. Do not use aspirin as this will increase bleeding (bruising) at injury site.   Warming up before exercise helps prevent muscle strains.  SEEK MEDICAL CARE IF:  There is increased pain or swelling in the affected area. MAKE SURE YOU:   Understand these instructions.   Will watch your condition.   Will get help right away if you are not doing well or get worse.  Document Released: 07/13/2005 Document Revised: 07/02/2011 Document Reviewed: 02/09/2007 [facility] Patient Information 2012 [place], [place].  RESOURCE GUIDE  Dental Problems  Patients with Medicaid: [facility] [phone] [address]  [address] Phone:  [phone]                                                  Phone:  [phone]  If unable to pay or uninsured, contact:  [facility] or [facility]. to become qualified for the adult dental clinic.  Chronic Pain Problems Contact [facility]  [phone] Patients need to be referred by their primary care doctor.  Insufficient Money for Medicine Contact [employer]:  call "211" or [facility] ([phone].  No Primary Care Doctor Call [facility]   ([phone] Other agencies that provide inexpensive medical care    [facility]  [phone]    [facility]  [phone]    [facility]  [phone]    [facility]  [phone]    [facility]  [phone]    [facility]  [phone]  Psychological Services [facility]  [phone] [facility]  [phone] [facility] Mental Health   [phone] (emergency services [phone])  Substance Abuse Resources [facility]  [phone] [facility] (930) [phone] [facility] [place] [phone] [address]ock [phone] Residential & Outpatient Substance Abuse Program  3198718659  Abuse/Neglect [facility] Child Abuse Hotline 8480068825 [facility] Child Abuse Hotline [phone] (After Hours)  Emergency Shelter [facility] [phone]  M[facility] [phone] [facility] [phone]  MRSA Hotline #:   626 609 3826    [facility] Resources  [facility] [place]     [employer]                          [place] [facility]. [address] [place]                       [address]      [address]  Blondell Reveal Phone:  [phone]                                   Phone:  [phone]                 Phone:  [phone]  [facility] Mental Health Phone:  [phone]  [facility] Child Abuse Hotline 3406876146 [phone] (After Hours)

## 2012-06-12 ENCOUNTER — Encounter (HOSPITAL_COMMUNITY): Payer: Self-pay | Admitting: *Deleted

## 2012-06-12 ENCOUNTER — Emergency Department (HOSPITAL_COMMUNITY)
Admission: EM | Admit: 2012-06-12 | Discharge: 2012-06-12 | Disposition: A | Discharge status: Home Health | Payer: BC Managed Care – PPO | Attending: Emergency Medicine | Admitting: Emergency Medicine

## 2012-06-12 ENCOUNTER — Emergency Department (HOSPITAL_COMMUNITY): Payer: BC Managed Care – PPO

## 2012-06-12 DIAGNOSIS — Y929 Unspecified place or not applicable: Secondary | ICD-10-CM | POA: Insufficient documentation

## 2012-06-12 DIAGNOSIS — W19XXXA Unspecified fall, initial encounter: Secondary | ICD-10-CM | POA: Insufficient documentation

## 2012-06-12 DIAGNOSIS — M25561 Pain in right knee: Secondary | ICD-10-CM

## 2012-06-12 DIAGNOSIS — Y939 Activity, unspecified: Secondary | ICD-10-CM | POA: Insufficient documentation

## 2012-06-12 DIAGNOSIS — F17200 Nicotine dependence, unspecified, uncomplicated: Secondary | ICD-10-CM | POA: Insufficient documentation

## 2012-06-12 DIAGNOSIS — M25569 Pain in unspecified knee: Secondary | ICD-10-CM | POA: Insufficient documentation

## 2012-06-12 MED ORDER — OXYCODONE-ACETAMINOPHEN 5-325 MG PO TABS
2.0000 | ORAL_TABLET | Freq: Once | ORAL | Status: AC
  Administered 2012-06-12: 2 via ORAL
  Filled 2012-06-12: qty 2

## 2012-06-12 MED ORDER — IBUPROFEN 400 MG PO TABS
600.0000 mg | ORAL_TABLET | Freq: Once | ORAL | Status: AC
  Administered 2012-06-12: 600 mg via ORAL
  Filled 2012-06-12: qty 1

## 2012-06-12 MED ORDER — IBUPROFEN 600 MG PO TABS: 600.0000 mg | ORAL_TABLET | Freq: Four times a day (QID) | ORAL | Status: DC | PRN

## 2012-06-12 NOTE — ED Notes (Signed)
Slight swelling noted to right knee. Family member at beside

## 2012-06-12 NOTE — ED Provider Notes (Signed)
History    26yM with R knee pain. Onset last night. [place] and felt a "pop." Was drinking vodka at the time. Persistent pain since. Mild ache at rest. Can ambulate but pain worse with this. No numbness, tingling or loss of strength. No other complaints.  CSN: [id number]  Arrival date & time 06/12/12  1136   First MD Initiated Contact with Patient 06/12/12 1237      Chief Complaint  Patient presents with  . Leg Pain    (Consider location/radiation/quality/duration/timing/severity/associated sxs/prior treatment) HPI  History reviewed. No pertinent past medical history.  Past Surgical History  Procedure Date  . Femur fracture surgery as child    History reviewed. No pertinent family history.  History  Substance Use Topics  . Smoking status: Current Every Day Smoker -- 0.5 packs/day for 7 years    Types: Cigarettes  . Smokeless tobacco: Former Neurosurgeon    Quit date: 11/06/2010  . Alcohol Use: No     Comment: last drink was 3 months prior      Review of Systems   Review of symptoms negative unless otherwise noted in HPI.   Allergies  Codeine  Home Medications   Current Outpatient Rx  Name  Route  Sig  Dispense  Refill  . PANTOPRAZOLE SODIUM 40 MG PO TBEC   Oral   Take 40 mg by mouth daily.         . IBUPROFEN 600 MG PO TABS   Oral   Take 1 tablet (600 mg total) by mouth every 6 (six) hours as needed for pain.   30 tablet   0     BP 144/93  Pulse 58  Temp 97.9 F (36.6 C) (Oral)  Resp 16  SpO2 96%  Physical Exam  Nursing note and vitals reviewed. Constitutional: He appears well-developed and well-nourished. No distress.  HENT:  Head: Normocephalic and atraumatic.  Eyes: Conjunctivae normal are normal. Right eye exhibits no discharge. Left eye exhibits no discharge.  Neck: Neck supple.  Cardiovascular: Normal rate, regular rhythm and normal heart sounds.  Exam reveals no gallop and no friction rub.   No murmur heard. Pulmonary/Chest: Effort normal  and breath sounds normal. No respiratory distress.  Abdominal: Soft. He exhibits no distension. There is no tenderness.  Musculoskeletal: He exhibits no edema and no tenderness.       Moderate diffuse R knee tenderness. Small effusion. Increase of pain with ROM. No ligamentous laxity appreciated. NVI distally. No concerning overlying skin changes.  Neurological: He is alert.  Skin: Skin is warm and dry.  Psychiatric: He has a normal mood and affect. His behavior is normal. Thought content normal.    ED Course  Procedures (including critical care time)  Labs Reviewed - No data to display Dg Knee Complete 4 Views Right  06/12/2012  *RADIOLOGY REPORT*  Clinical Data: History of leg pain after falling down stairs.  RIGHT KNEE - COMPLETE 4+ VIEW  Comparison: No priors.  Findings: Five views of the right knee demonstrate no acute displaced fracture, subluxation, dislocation, joint or soft tissue air or abnormality.  There is an area of cortical thickening and irregular sclerosis and lucency in the distal third of the femoral diaphysis which is unusual in appearance.  IMPRESSION: 1.  No evidence of acute traumatic injury to the right knee. 2.  Irregular appearance of the distal third of the femoral diaphysis, as above.  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  Alternatively,  if the patient has had a prior femoral fracture, this may represent an old healed fracture.   Original Report Authenticated By: Trudie Reed, M.D.      1. Knee pain, right       MDM  26yM with R knee pain. XR without evidence of acute injury. Irregular appears of distal femur likely from old injury as pt reports prior distal femur fx. Small effusion on exam. Concern for possible internal derangement. Will place in immobilizer and have follow-up with ortho.        Raeford Razor, MD 06/16/12 [phone]

## 2012-06-12 NOTE — ED Notes (Signed)
Patient transported to X-ray 

## 2012-06-12 NOTE — ED Notes (Signed)
Returned from  X-ray

## 2012-06-12 NOTE — Progress Notes (Signed)
Orthopedic Tech Progress Note Patient Details:  Roy Wilson 1985/09/03 [id number]  Ortho Devices Type of Ortho Device: Knee Immobilizer Ortho Device/Splint Location: RIGHT KNEE IMMOBILIZER PT REFUSED CRUTCHES Ortho Device/Splint Interventions: Application   [name], [name] 06/12/2012, 1:22 PM

## 2012-06-12 NOTE — ED Notes (Signed)
Reports a popping sensation to right knee last night, having pain since. Hx of leg injury.

## 2012-11-03 ENCOUNTER — Emergency Department (HOSPITAL_COMMUNITY)
Admission: EM | Admit: 2012-11-03 | Discharge: 2012-11-03 | Discharge status: Against Medical Advice | Payer: BC Managed Care – PPO | Attending: Emergency Medicine | Admitting: Emergency Medicine

## 2012-11-03 ENCOUNTER — Encounter (HOSPITAL_COMMUNITY): Payer: Self-pay | Admitting: Emergency Medicine

## 2012-11-03 DIAGNOSIS — F17200 Nicotine dependence, unspecified, uncomplicated: Secondary | ICD-10-CM | POA: Insufficient documentation

## 2012-11-03 DIAGNOSIS — R05 Cough: Secondary | ICD-10-CM | POA: Insufficient documentation

## 2012-11-03 DIAGNOSIS — R059 Cough, unspecified: Secondary | ICD-10-CM | POA: Insufficient documentation

## 2012-11-03 DIAGNOSIS — R0602 Shortness of breath: Secondary | ICD-10-CM | POA: Insufficient documentation

## 2012-11-03 NOTE — ED Notes (Addendum)
C/o generalized body aches, sob, diaphoresis, "shaking", and dry cough since 5:30pm.  Pt anxious.

## 2012-11-03 NOTE — ED Notes (Signed)
Pt seen walking out front door. Did not want to wait any longer

## 2013-01-29 ENCOUNTER — Emergency Department (HOSPITAL_COMMUNITY): Payer: BC Managed Care – PPO

## 2013-01-29 ENCOUNTER — Emergency Department (HOSPITAL_COMMUNITY): Payer: BC Managed Care – PPO | Admitting: Anesthesiology

## 2013-01-29 ENCOUNTER — Encounter (HOSPITAL_COMMUNITY): Payer: Self-pay | Admitting: Anesthesiology

## 2013-01-29 ENCOUNTER — Encounter (HOSPITAL_COMMUNITY)
Admission: EM | Disposition: A | Discharge status: Home / Self Care | Payer: Self-pay | Source: Home / Self Care | Attending: Emergency Medicine

## 2013-01-29 ENCOUNTER — Encounter (HOSPITAL_COMMUNITY): Payer: Self-pay | Admitting: Emergency Medicine

## 2013-01-29 ENCOUNTER — Ambulatory Visit (HOSPITAL_COMMUNITY)
Admission: EM | Admit: 2013-01-29 | Discharge: 2013-01-30 | Disposition: A | Discharge status: Home / Self Care | Payer: BC Managed Care – PPO | Attending: General Surgery | Admitting: General Surgery

## 2013-01-29 DIAGNOSIS — T40605A Adverse effect of unspecified narcotics, initial encounter: Secondary | ICD-10-CM | POA: Insufficient documentation

## 2013-01-29 DIAGNOSIS — K3580 Unspecified acute appendicitis: Secondary | ICD-10-CM | POA: Insufficient documentation

## 2013-01-29 DIAGNOSIS — R109 Unspecified abdominal pain: Secondary | ICD-10-CM | POA: Insufficient documentation

## 2013-01-29 DIAGNOSIS — K37 Unspecified appendicitis: Secondary | ICD-10-CM

## 2013-01-29 DIAGNOSIS — L299 Pruritus, unspecified: Secondary | ICD-10-CM | POA: Insufficient documentation

## 2013-01-29 HISTORY — PX: LAPAROSCOPIC APPENDECTOMY: SHX408

## 2013-01-29 LAB — POCT I-STAT, CHEM 8
Creatinine, Ser: 0.8 mg/dL (ref 0.50–1.35)
Glucose, Bld: 114 mg/dL — ABNORMAL HIGH (ref 70–99)
HCT: 51 % (ref 39.0–52.0)
Hemoglobin: 17.3 g/dL — ABNORMAL HIGH (ref 13.0–17.0)
Sodium: 140 mEq/L (ref 135–145)
TCO2: 25 mmol/L (ref 0–100)

## 2013-01-29 LAB — CBC WITH DIFFERENTIAL/PLATELET
Basophils Absolute: 0 10*3/uL (ref 0.0–0.1)
Basophils Relative: 0 % (ref 0–1)
Eosinophils Absolute: 0.1 10*3/uL (ref 0.0–0.7)
Hemoglobin: 16.7 g/dL (ref 13.0–17.0)
MCH: 31.6 pg (ref 26.0–34.0)
MCHC: 36.5 g/dL — ABNORMAL HIGH (ref 30.0–36.0)
Monocytes Relative: 7 % (ref 3–12)
Neutro Abs: 15 10*3/uL — ABNORMAL HIGH (ref 1.7–7.7)
Neutrophils Relative %: 80 % — ABNORMAL HIGH (ref 43–77)
Platelets: 300 10*3/uL (ref 150–400)
RDW: 12.8 % (ref 11.5–15.5)

## 2013-01-29 LAB — URINALYSIS, ROUTINE W REFLEX MICROSCOPIC
Nitrite: NEGATIVE
Protein, ur: NEGATIVE mg/dL
Urobilinogen, UA: 0.2 mg/dL (ref 0.0–1.0)

## 2013-01-29 LAB — URINE MICROSCOPIC-ADD ON

## 2013-01-29 SURGERY — APPENDECTOMY, LAPAROSCOPIC
Anesthesia: General | Site: Abdomen | Wound class: Contaminated

## 2013-01-29 MED ORDER — DEXTROSE 5 % IV SOLN
INTRAVENOUS | Status: DC | PRN
  Administered 2013-01-29: 18:00:00 via INTRAVENOUS

## 2013-01-29 MED ORDER — ONDANSETRON HCL 4 MG PO TABS: 4.0000 mg | ORAL_TABLET | Freq: Four times a day (QID) | ORAL | Status: DC | PRN

## 2013-01-29 MED ORDER — NEOSTIGMINE METHYLSULFATE 1 MG/ML IJ SOLN
INTRAMUSCULAR | Status: DC | PRN
  Administered 2013-01-29: 5 mg via INTRAVENOUS

## 2013-01-29 MED ORDER — ENOXAPARIN SODIUM 40 MG/0.4ML ~~LOC~~ SOLN
40.0000 mg | SUBCUTANEOUS | Status: DC
  Filled 2013-01-29: qty 0.4

## 2013-01-29 MED ORDER — IOHEXOL 300 MG/ML  SOLN
25.0000 mL | INTRAMUSCULAR | Status: DC | PRN
  Administered 2013-01-29: 25 mL via ORAL

## 2013-01-29 MED ORDER — FENTANYL CITRATE 0.05 MG/ML IJ SOLN
INTRAMUSCULAR | Status: DC | PRN
  Administered 2013-01-29 (×3): 100 ug via INTRAVENOUS

## 2013-01-29 MED ORDER — METOCLOPRAMIDE HCL 5 MG/ML IJ SOLN
INTRAMUSCULAR | Status: DC | PRN
  Administered 2013-01-29: 10 mg via INTRAVENOUS

## 2013-01-29 MED ORDER — HYDROMORPHONE HCL PF 1 MG/ML IJ SOLN
1.0000 mg | Freq: Once | INTRAMUSCULAR | Status: AC
  Administered 2013-01-29: 1 mg via INTRAVENOUS
  Filled 2013-01-29: qty 1

## 2013-01-29 MED ORDER — BUPIVACAINE-EPINEPHRINE 0.25% -1:200000 IJ SOLN
INTRAMUSCULAR | Status: DC | PRN
  Administered 2013-01-29: 15 mL

## 2013-01-29 MED ORDER — KETOROLAC TROMETHAMINE 30 MG/ML IJ SOLN
INTRAMUSCULAR | Status: DC | PRN
  Administered 2013-01-29: 30 mg via INTRAVENOUS

## 2013-01-29 MED ORDER — DEXTROSE 5 % IV SOLN
2.0000 g | Freq: Four times a day (QID) | INTRAVENOUS | Status: DC
  Administered 2013-01-29: 2 g via INTRAVENOUS
  Filled 2013-01-29 (×4): qty 2

## 2013-01-29 MED ORDER — DEXTROSE 5 % IV SOLN
1.0000 g | Freq: Four times a day (QID) | INTRAVENOUS | Status: AC
  Administered 2013-01-30: 1 g via INTRAVENOUS
  Filled 2013-01-29 (×2): qty 1

## 2013-01-29 MED ORDER — KETOROLAC TROMETHAMINE 15 MG/ML IJ SOLN
15.0000 mg | Freq: Four times a day (QID) | INTRAMUSCULAR | Status: DC
  Administered 2013-01-30 (×3): 15 mg via INTRAVENOUS
  Filled 2013-01-29 (×7): qty 1

## 2013-01-29 MED ORDER — ACETAMINOPHEN 325 MG PO TABS: 650.0000 mg | ORAL_TABLET | ORAL | Status: DC | PRN

## 2013-01-29 MED ORDER — LIDOCAINE HCL (CARDIAC) 20 MG/ML IV SOLN
INTRAVENOUS | Status: DC | PRN
  Administered 2013-01-29: 70 mg via INTRAVENOUS

## 2013-01-29 MED ORDER — ONDANSETRON HCL 4 MG/2ML IJ SOLN
INTRAMUSCULAR | Status: DC | PRN
  Administered 2013-01-29: 4 mg via INTRAVENOUS

## 2013-01-29 MED ORDER — MIDAZOLAM HCL 5 MG/5ML IJ SOLN
INTRAMUSCULAR | Status: DC | PRN
  Administered 2013-01-29 (×2): 2 mg via INTRAVENOUS

## 2013-01-29 MED ORDER — HYDROMORPHONE HCL PF 1 MG/ML IJ SOLN
0.2500 mg | INTRAMUSCULAR | Status: DC | PRN
  Administered 2013-01-29 (×2): 0.5 mg via INTRAVENOUS

## 2013-01-29 MED ORDER — OXYCODONE-ACETAMINOPHEN 5-325 MG PO TABS
1.0000 | ORAL_TABLET | ORAL | Status: DC | PRN
  Administered 2013-01-29: 2 via ORAL
  Filled 2013-01-29: qty 2

## 2013-01-29 MED ORDER — LACTATED RINGERS IV SOLN
INTRAVENOUS | Status: DC | PRN
  Administered 2013-01-29 (×2): via INTRAVENOUS

## 2013-01-29 MED ORDER — ROCURONIUM BROMIDE 100 MG/10ML IV SOLN
INTRAVENOUS | Status: DC | PRN
  Administered 2013-01-29: 50 mg via INTRAVENOUS

## 2013-01-29 MED ORDER — KETOROLAC TROMETHAMINE 30 MG/ML IJ SOLN: 15.0000 mg | Freq: Once | INTRAMUSCULAR | Status: DC | PRN

## 2013-01-29 MED ORDER — ONDANSETRON HCL 4 MG/2ML IJ SOLN: 4.0000 mg | Freq: Four times a day (QID) | INTRAMUSCULAR | Status: DC | PRN

## 2013-01-29 MED ORDER — GLYCOPYRROLATE 0.2 MG/ML IJ SOLN
INTRAMUSCULAR | Status: DC | PRN
  Administered 2013-01-29: 0.6 mg via INTRAVENOUS

## 2013-01-29 MED ORDER — ONDANSETRON HCL 4 MG/2ML IJ SOLN
4.0000 mg | INTRAMUSCULAR | Status: AC
  Administered 2013-01-29: 4 mg via INTRAVENOUS
  Filled 2013-01-29: qty 2

## 2013-01-29 MED ORDER — ARTIFICIAL TEARS OP OINT
TOPICAL_OINTMENT | OPHTHALMIC | Status: DC | PRN
  Administered 2013-01-29: 1 via OPHTHALMIC

## 2013-01-29 MED ORDER — IOHEXOL 300 MG/ML  SOLN
100.0000 mL | Freq: Once | INTRAMUSCULAR | Status: AC | PRN
  Administered 2013-01-29: 100 mL via INTRAVENOUS

## 2013-01-29 MED ORDER — DEXAMETHASONE SODIUM PHOSPHATE 4 MG/ML IJ SOLN
INTRAMUSCULAR | Status: DC | PRN
  Administered 2013-01-29: 8 mg via INTRAVENOUS

## 2013-01-29 MED ORDER — PROPOFOL 10 MG/ML IV BOLUS
INTRAVENOUS | Status: DC | PRN
  Administered 2013-01-29: 200 mg via INTRAVENOUS

## 2013-01-29 MED ORDER — ONDANSETRON HCL 4 MG/2ML IJ SOLN: 4.0000 mg | Freq: Once | INTRAMUSCULAR | Status: DC | PRN

## 2013-01-29 MED ORDER — HYDROMORPHONE HCL PF 1 MG/ML IJ SOLN
1.0000 mg | INTRAMUSCULAR | Status: DC | PRN
  Administered 2013-01-30: 1 mg via INTRAVENOUS
  Administered 2013-01-30: 2 mg via INTRAVENOUS
  Filled 2013-01-29: qty 2
  Filled 2013-01-29 (×2): qty 1

## 2013-01-29 MED ORDER — SODIUM CHLORIDE 0.9 % IV BOLUS (SEPSIS)
1000.0000 mL | Freq: Once | INTRAVENOUS | Status: AC
  Administered 2013-01-29: 1000 mL via INTRAVENOUS

## 2013-01-29 MED ORDER — PANTOPRAZOLE SODIUM 40 MG PO TBEC
40.0000 mg | DELAYED_RELEASE_TABLET | Freq: Every day | ORAL | Status: DC
  Administered 2013-01-30 (×2): 40 mg via ORAL
  Filled 2013-01-29 (×2): qty 1

## 2013-01-29 MED ORDER — KCL IN DEXTROSE-NACL 20-5-0.45 MEQ/L-%-% IV SOLN
INTRAVENOUS | Status: DC
  Administered 2013-01-29: 21:00:00 via INTRAVENOUS
  Filled 2013-01-29 (×2): qty 1000

## 2013-01-29 MED ORDER — HYDROMORPHONE HCL PF 1 MG/ML IJ SOLN
INTRAMUSCULAR | Status: AC
  Filled 2013-01-29: qty 1

## 2013-01-29 MED ORDER — KCL IN DEXTROSE-NACL 20-5-0.45 MEQ/L-%-% IV SOLN
INTRAVENOUS | Status: AC
  Filled 2013-01-29: qty 1000

## 2013-01-29 MED ORDER — SODIUM CHLORIDE 0.9 % IV BOLUS (SEPSIS): 1000.0000 mL | Freq: Once | INTRAVENOUS | Status: DC

## 2013-01-29 SURGICAL SUPPLY — 33 items
BLADE SURG ROTATE 9660 (MISCELLANEOUS) IMPLANT
CANISTER SUCTION 2500CC (MISCELLANEOUS) ×2 IMPLANT
CHLORAPREP W/TINT 26ML (MISCELLANEOUS) ×2 IMPLANT
CLOTH BEACON ORANGE TIMEOUT ST (SAFETY) ×2 IMPLANT
COVER SURGICAL LIGHT HANDLE (MISCELLANEOUS) ×2 IMPLANT
CUTTER LINEAR ENDO 35 ETS (STAPLE) ×2 IMPLANT
DECANTER SPIKE VIAL GLASS SM (MISCELLANEOUS) ×2 IMPLANT
DERMABOND ADVANCED (GAUZE/BANDAGES/DRESSINGS) ×1
DERMABOND ADVANCED .7 DNX12 (GAUZE/BANDAGES/DRESSINGS) ×1 IMPLANT
DRAPE UTILITY 15X26 W/TAPE STR (DRAPE) ×4 IMPLANT
ELECT REM PT RETURN 9FT ADLT (ELECTROSURGICAL) ×2
ELECTRODE REM PT RTRN 9FT ADLT (ELECTROSURGICAL) ×1 IMPLANT
GLOVE BIOGEL PI IND STRL 8 (GLOVE) ×1 IMPLANT
GLOVE BIOGEL PI INDICATOR 8 (GLOVE) ×1
GLOVE ECLIPSE 7.5 STRL STRAW (GLOVE) ×2 IMPLANT
GOWN STRL NON-REIN LRG LVL3 (GOWN DISPOSABLE) ×4 IMPLANT
KIT BASIN OR (CUSTOM PROCEDURE TRAY) ×2 IMPLANT
KIT ROOM TURNOVER OR (KITS) ×2 IMPLANT
NS IRRIG 1000ML POUR BTL (IV SOLUTION) ×2 IMPLANT
PAD ARMBOARD 7.5X6 YLW CONV (MISCELLANEOUS) ×4 IMPLANT
PENCIL BUTTON HOLSTER BLD 10FT (ELECTRODE) IMPLANT
POUCH SPECIMEN RETRIEVAL 10MM (ENDOMECHANICALS) ×2 IMPLANT
SET IRRIG TUBING LAPAROSCOPIC (IRRIGATION / IRRIGATOR) ×2 IMPLANT
SPECIMEN JAR SMALL (MISCELLANEOUS) ×2 IMPLANT
SUT MNCRL AB 4-0 PS2 18 (SUTURE) ×2 IMPLANT
TOWEL OR 17X24 6PK STRL BLUE (TOWEL DISPOSABLE) ×2 IMPLANT
TOWEL OR 17X26 10 PK STRL BLUE (TOWEL DISPOSABLE) ×2 IMPLANT
TRAY FOLEY CATH 14FR (SET/KITS/TRAYS/PACK) ×2 IMPLANT
TRAY LAPAROSCOPIC (CUSTOM PROCEDURE TRAY) ×2 IMPLANT
TROCAR XCEL 12X100 BLDLESS (ENDOMECHANICALS) ×2 IMPLANT
TROCAR XCEL BLUNT TIP 100MML (ENDOMECHANICALS) ×2 IMPLANT
TROCAR XCEL NON-BLD 5MMX100MML (ENDOMECHANICALS) ×2 IMPLANT
WATER STERILE IRR 1000ML POUR (IV SOLUTION) IMPLANT

## 2013-01-29 NOTE — Anesthesia Postprocedure Evaluation (Signed)
  Anesthesia Post-op Note  Patient: Roy Wilson  Procedure(s) Performed: Procedure(s): APPENDECTOMY LAPAROSCOPIC (N/A)  Patient Location: PACU  Anesthesia Type:General  Level of Consciousness: awake, alert  and oriented  Airway and Oxygen Therapy: Patient Spontanous Breathing and Patient connected to nasal cannula oxygen  Post-op Pain: mild  Post-op Assessment: Post-op Vital signs reviewed, Patient's Cardiovascular Status Stable, Patent Airway and Pain level controlled  Post-op Vital Signs: stable  Complications: No apparent anesthesia complications

## 2013-01-29 NOTE — Anesthesia Procedure Notes (Signed)
Procedure Name: Intubation Date/Time: 01/29/2013 7:01 PM Performed by: Wray Kearns A Pre-anesthesia Checklist: Patient identified, Timeout performed, Emergency Drugs available, Suction available and Patient being monitored Patient Re-evaluated:Patient Re-evaluated prior to inductionOxygen Delivery Method: Circle system utilized Preoxygenation: Pre-oxygenation with 100% oxygen Intubation Type: Combination inhalational/ intravenous induction and Cricoid Pressure applied Ventilation: Mask ventilation without difficulty Laryngoscope Size: Mac and 4 Grade View: Grade I Tube type: Oral Tube size: 8.0 mm Number of attempts: 1 Airway Equipment and Method: Stylet Placement Confirmation: ETT inserted through vocal cords under direct vision,  breath sounds checked- equal and bilateral and positive ETCO2 Secured at: 23 cm Tube secured with: Tape Dental Injury: Teeth and Oropharynx as per pre-operative assessment

## 2013-01-29 NOTE — Op Note (Signed)
OPERATIVE REPORT  DATE OF OPERATION: 01/29/2013  PATIENT:  Roy Wilson  27 y.o. male  PRE-OPERATIVE DIAGNOSIS:  Acute Appendicitis  POST-OPERATIVE DIAGNOSIS: Acute appendicitis without rupture  PROCEDURE:  Procedure(s): APPENDECTOMY LAPAROSCOPIC  SURGEON:  Surgeon(s): Cherylynn Ridges, MD  ASSISTANT: None  ANESTHESIA:   general  EBL: <20 ml  BLOOD ADMINISTERED: none  DRAINS: none   SPECIMEN:  Source of Specimen:  Appendix  COUNTS CORRECT:  YES  PROCEDURE DETAILS: The patient was taken to the operating room and placed on the table in the supine position. After an adequate general endotracheal anesthetic was administered she was prepped and draped in the usual sterile manner exposing his entire abdomen.  After a proper time out was performed identifying the patient and the procedure to be performed, a supraumbilical midline incision was made using a #15 blade and taken down to the midline fascia. The fascia was grasped with Kocher clamps. We incised between the Kocher clamps using a 15 blade. We bluntly dissected down into the peritoneal cavity. A pursestring suture of 0 Vicryl was passed around the fascial opening. A Hassan cannula was passed into the peritoneal cavity and secured in place with pursestring suture. Once this was done carbon dioxide gas was insufflated into the peritoneal cavity up to a maximal intra-abdominal pressure of 15 mm mercury.  The patient was placed in Trendelenburg position with the left side down. A right upper quadrant 5 mm cannula and the left lower quadrant 12 mm cannula passed under direct vision. The appendix was noted to be attached to the cecum and coursed posteriorly laterally behind the cecum on the right side. We were able to dissected it free using a dissector. The mesentery and mesoappendix were taken with electrocautery and no endoclips. There was adequate hemostasis. We then dissected the base of the appendix at the cecum and came across it  using a 3.5 mm closure Endo GIA. With the appendix detached we retrieved it from the left lower quadrant site using an Endo Catch bag.  Once the appendix was removed we irrigated the right lower quadrant site with saline solution. There was excellent hemostasis. We aspirated all fluid and gas from above the liver. The pursestring suture at the supra-umbilical site was used to close the fascia. All cannulas were removed. We injected cortisone Marcaine with epi at all sites. We then closed the left lower quadrant and a super umbilical skin sites using running subcuticular stitch of 4-0 Monocryl. Steri-Strips Dermabond and Tegaderm used to complete the dressings. All counts were correct.  PATIENT DISPOSITION:  PACU - hemodynamically stable.   Cherylynn Ridges 7/6/20148:04 PM

## 2013-01-29 NOTE — ED Notes (Signed)
Cefoxitin Iv sent with PT at request of DR .

## 2013-01-29 NOTE — ED Provider Notes (Signed)
History    CSN: [id number] Arrival date & time 01/29/13  1351  First MD Initiated Contact with Patient 01/29/13 1405     Chief Complaint  Patient presents with  . Abdominal Pain   (Consider location/radiation/quality/duration/timing/severity/associated sxs/prior Treatment) HPI Comments: Patient is a 27 year old male who presents for abdominal pain with onset last night. Patient states pain began periumbilically and his rate to his lower abdomen; R>L. Patient states the pain is sharp in nature and nonradiating. He states that flexing his hips sometimes lessens the pain and certain movements aggravate it. Patient admits to associated nausea with emesis x2 this morning which were nonbloody and nonbilious. Patient also admits to a watery bowel movement this morning without blood. Patient denies fevers, chest pain or shortness of breath, urinary symptoms, melena or hematochezia, and numbness or tingling in his extremities. Patient denies a history of abdominal surgery  Patient is a 27 y.o. male presenting with abdominal pain. The history is provided by the patient. No language interpreter was used.  Abdominal Pain Associated symptoms include abdominal pain, nausea and vomiting. Pertinent negatives include no chest pain or fever.   History reviewed. No pertinent past medical history. Past Surgical History  Procedure Laterality Date  . Femur fracture surgery  as child   History reviewed. No pertinent family history. History  Substance Use Topics  . Smoking status: Current Every Day Smoker -- 0.50 packs/day for 7 years    Types: Cigarettes  . Smokeless tobacco: Former Neurosurgeon    Quit date: 11/06/2010  . Alcohol Use: Yes    Review of Systems  Constitutional: Negative for fever.  Respiratory: Negative for shortness of breath.   Cardiovascular: Negative for chest pain.  Gastrointestinal: Positive for nausea, vomiting, abdominal pain and diarrhea.  Genitourinary: Negative for dysuria and  hematuria.  All other systems reviewed and are negative.    Allergies  Codeine  Home Medications   Current Outpatient Rx  Name  Route  Sig  Dispense  Refill  . ibuprofen (ADVIL,MOTRIN) 600 MG tablet   Oral   Take 1 tablet (600 mg total) by mouth every 6 (six) hours as needed for pain.   30 tablet   0   . pantoprazole (PROTONIX) 40 MG tablet   Oral   Take 40 mg by mouth daily.          BP 139/89  Pulse 73  Temp(Src) 97.8 F (36.6 C) (Oral)  Resp 20  SpO2 98% Physical Exam  Nursing note and vitals reviewed. Constitutional: He is oriented to person, place, and time. He appears well-developed and well-nourished. No distress.  HENT:  Head: Normocephalic and atraumatic.  Mouth/Throat: Oropharynx is clear and moist. No oropharyngeal exudate.  Eyes: Conjunctivae and EOM are normal. Pupils are equal, round, and reactive to light. No scleral icterus.  Neck: Normal range of motion.  Cardiovascular: Normal rate, regular rhythm and normal heart sounds.   Pulmonary/Chest: Effort normal and breath sounds normal. No respiratory distress. He has no wheezes. He has no rales.  Abdominal: Normal appearance and bowel sounds are normal. He exhibits no pulsatile midline mass. There is tenderness in the right lower quadrant and left lower quadrant. There is tenderness at McBurney's point. There is no CVA tenderness and negative Murphy's sign.    Focal TTP in RLQ. No peritoneal signs  Musculoskeletal: Normal range of motion.  Neurological: He is alert and oriented to person, place, and time.  Skin: Skin is warm and dry. No rash noted. He is  not diaphoretic. No erythema. No pallor.  Psychiatric: He has a normal mood and affect. His behavior is normal.    ED Course  Procedures (including critical care time) Labs Reviewed  CBC WITH DIFFERENTIAL - Abnormal; Notable for the following:    WBC 18.7 (*)    MCHC 36.5 (*)    Neutrophils Relative % 80 (*)    Neutro Abs 15.0 (*)    Monocytes  Absolute 1.3 (*)    All other components within normal limits  URINALYSIS, ROUTINE W REFLEX MICROSCOPIC - Abnormal; Notable for the following:    Hgb urine dipstick TRACE (*)    All other components within normal limits  POCT I-STAT, CHEM 8 - Abnormal; Notable for the following:    BUN <3 (*)    Glucose, Bld 114 (*)    Hemoglobin 17.3 (*)    All other components within normal limits  URINE MICROSCOPIC-ADD ON  TYPE AND SCREEN   Ct Abdomen Pelvis W Contrast  01/29/2013   *RADIOLOGY REPORT*  Clinical Data: Right lower quadrant pain.  Elevated white count.  CT ABDOMEN AND PELVIS WITH CONTRAST  Technique:  Multidetector CT imaging of the abdomen and pelvis was performed following the standard protocol during bolus administration of intravenous contrast.  Contrast: OMNIPAQUE IOHEXOL 300 MG/ML  SOLN  Comparison: 03/15/2007  Findings: Lung bases are clear.  No effusions.  Heart is normal size.  Liver, gallbladder, spleen, pancreas, adrenals and kidneys are normal.  The appendix is enlarged and inflamed.  Surrounding inflammatory change.  Appendicolith near the base of the appendix.  Findings compatible with acute appendicitis.  No free fluid or free air.  Stomach, large and small bowel are unremarkable.  Aorta is normal caliber.  No free fluid, free air or adenopathy.  No acute bony abnormality.  IMPRESSION: Dilated, inflamed appendix compatible with acute appendicitis.   Original Report Authenticated By: Charlett Nose, M.D.    1. Appendicitis     MDM  RLQ pain onset last night with N/V. No peritoneal signs on abdominal exam. Patient afebrile and hemodynamically stable. CT findings c/w acute retrocecal appendicitis. IV Cefoxitin, type and screen, and EKG ordered. Dr. Lindie Spruce to see and admit.  Antony Madura, PA-C 01/29/13 1757

## 2013-01-29 NOTE — ED Provider Notes (Signed)
Medical screening examination/treatment/procedure(s) were conducted as a shared visit with non-physician practitioner(s) and myself.  I personally evaluated the patient during the encounter [name], M.D.  Roy Wilson is a 27 y.o. male presenting with right lower "a pain x2 days. Patient's pain began in the center of his abdomen and migrated to the right lower cautery, is now sharp, it was worse on bumps on the ride to the hospital, is worse to palpation, he's had some subjective fevers, chills and nausea and vomiting which started today. Patient still has appendix and gallbladder. Pain is moderate to severe but is been helped with pain medicine here in the emergency department. He denies any chest pain, shortness of breath, dizziness, lightheadedness, changes in vision, arthralgias, myalgias.   VITAL SIGNS:   Filed Vitals:   01/29/13 1743  BP: 142/75  Pulse: 75  Temp: 97.8 F (36.6 C)  Resp: 18   CONSTITUTIONAL: Awake, oriented, appears non-toxic HENT: Atraumatic, normocephalic, oral mucosa pink and moist, airway patent. Nares patent without drainage. External ears normal. EYES: Conjunctiva clear, EOMI, PERRLA NECK: Trachea midline, non-tender, supple CARDIOVASCULAR: Normal heart rate, Normal rhythm, No murmurs, rubs, gallops PULMONARY/CHEST: Clear to auscultation, no rhonchi, wheezes, or rales. Symmetrical breath sounds. Non-tender. ABDOMINAL: Tenderness in the right lower quadrant with rebound tenderness, voluntary guarding. BS normal. NEUROLOGIC: Non-focal, moving all four extremities, no gross sensory or motor deficits. EXTREMITIES: No clubbing, cyanosis, or edema SKIN: Warm, Dry, No erythema, No rash  MDM: Clinically patient presents with appendicitis, this is confirmed on CT scan. Dr. Lindie Spruce consulted for surgical management - pt treated with pain medicine, kept NPO and given dose of cefoxitin    [name], MD 01/29/13 2020

## 2013-01-29 NOTE — Anesthesia Preprocedure Evaluation (Signed)
Anesthesia Evaluation  Patient identified by MRN, date of birth, ID band Patient awake    Reviewed: Allergy & Precautions, H&P , NPO status , Patient's Chart, lab work & pertinent test results  Airway Mallampati: II TM Distance: >3 FB   Mouth opening: Limited Mouth Opening  Dental  (+) Teeth Intact, Dental Advisory Given and Poor Dentition   Pulmonary  breath sounds clear to auscultation        Cardiovascular Rhythm:Regular Rate:Normal     Neuro/Psych    GI/Hepatic   Endo/Other    Renal/GU      Musculoskeletal   Abdominal   Peds  Hematology   Anesthesia Other Findings   Reproductive/Obstetrics                           Anesthesia Physical Anesthesia Plan  ASA: II and emergent  Anesthesia Plan: General   Post-op Pain Management:    Induction: Intravenous  Airway Management Planned: Oral ETT  Additional Equipment:   Intra-op Plan:   Post-operative Plan: Extubation in OR  Informed Consent: I have reviewed the patients History and Physical, chart, labs and discussed the procedure including the risks, benefits and alternatives for the proposed anesthesia with the patient or authorized representative who has indicated his/her understanding and acceptance.   Dental advisory given  Plan Discussed with: CRNA and Anesthesiologist  Anesthesia Plan Comments: (Acute Appendicitis Smoker Poor dentition  Plan GA with oral ETT  Kipp Brood, MD)        Anesthesia Quick Evaluation

## 2013-01-29 NOTE — Preoperative (Signed)
Beta Blockers   Reason not to administer Beta Blockers:Not Applicable 

## 2013-01-29 NOTE — Transfer of Care (Signed)
Immediate Anesthesia Transfer of Care Note  Patient: Roy Wilson  Procedure(s) Performed: Procedure(s): APPENDECTOMY LAPAROSCOPIC (N/A)  Patient Location: PACU  Anesthesia Type:General  Level of Consciousness: oriented, sedated, patient cooperative and responds to stimulation  Airway & Oxygen Therapy: Patient Spontanous Breathing and Patient connected to face mask oxygen  Post-op Assessment: Report given to PACU RN, Post -op Vital signs reviewed and stable, Patient moving all extremities and Patient moving all extremities X 4  Post vital signs: Reviewed and stable  Complications: No apparent anesthesia complications

## 2013-01-29 NOTE — ED Notes (Signed)
Pt c/o lower abd pain with N/V/D x 2 days

## 2013-01-29 NOTE — H&P (Signed)
Roy Wilson is an 27 y.o. male.   Chief Complaint: Abdominal pain HPI: The patient's abdominal pain started yesterday evening and progressed throughout the night.  He could not sleep.  Vomited at least twice.  No fevers or chills.  Found to have acute appendicitis on CT.  History reviewed. No pertinent past medical history.  Past Surgical History  Procedure Laterality Date  . Femur fracture surgery  as child    History reviewed. No pertinent family history. Social History:  reports that he has been smoking Cigarettes.  He has a 3.5 pack-year smoking history. He quit smokeless tobacco use about 2 years ago. He reports that  drinks alcohol. He reports that he uses illicit drugs (Marijuana).  Allergies:  Allergies  Allergen Reactions  . Codeine Hives     (Not in a hospital admission)  Results for orders placed during the hospital encounter of 01/29/13 (from the past 48 hour(s))  CBC WITH DIFFERENTIAL     Status: Abnormal   Collection Time    01/29/13  2:18 PM      Result Value Range   WBC 18.7 (*) 4.0 - 10.5 K/uL   RBC 5.29  4.22 - 5.81 MIL/uL   Hemoglobin 16.7  13.0 - 17.0 g/dL   HCT 40.9  81.1 - 91.4 %   MCV 86.4  78.0 - 100.0 fL   MCH 31.6  26.0 - 34.0 pg   MCHC 36.5 (*) 30.0 - 36.0 g/dL   RDW 78.2  95.6 - 21.3 %   Platelets 300  150 - 400 K/uL   Neutrophils Relative % 80 (*) 43 - 77 %   Neutro Abs 15.0 (*) 1.7 - 7.7 K/uL   Lymphocytes Relative 13  12 - 46 %   Lymphs Abs 2.4  0.7 - 4.0 K/uL   Monocytes Relative 7  3 - 12 %   Monocytes Absolute 1.3 (*) 0.1 - 1.0 K/uL   Eosinophils Relative 0  0 - 5 %   Eosinophils Absolute 0.1  0.0 - 0.7 K/uL   Basophils Relative 0  0 - 1 %   Basophils Absolute 0.0  0.0 - 0.1 K/uL  POCT I-STAT, CHEM 8     Status: Abnormal   Collection Time    01/29/13  2:54 PM      Result Value Range   Sodium 140  135 - 145 mEq/L   Potassium 3.6  3.5 - 5.1 mEq/L   Chloride 102  96 - 112 mEq/L   BUN <3 (*) 6 - 23 mg/dL   Creatinine, Ser 0.86   0.50 - 1.35 mg/dL   Glucose, Bld 578 (*) 70 - 99 mg/dL   Calcium, Ion 4.69  6.29 - 1.23 mmol/L   TCO2 25  0 - 100 mmol/L   Hemoglobin 17.3 (*) 13.0 - 17.0 g/dL   HCT 52.8  41.3 - 24.4 %  URINALYSIS, ROUTINE W REFLEX MICROSCOPIC     Status: Abnormal   Collection Time    01/29/13  2:59 PM      Result Value Range   Color, Urine YELLOW  YELLOW   APPearance CLEAR  CLEAR   Specific Gravity, Urine 1.011  1.005 - 1.030   pH 6.0  5.0 - 8.0   Glucose, UA NEGATIVE  NEGATIVE mg/dL   Hgb urine dipstick TRACE (*) NEGATIVE   Bilirubin Urine NEGATIVE  NEGATIVE   Ketones, ur NEGATIVE  NEGATIVE mg/dL   Protein, ur NEGATIVE  NEGATIVE mg/dL   Urobilinogen, UA  0.2  0.0 - 1.0 mg/dL   Nitrite NEGATIVE  NEGATIVE   Leukocytes, UA NEGATIVE  NEGATIVE  URINE MICROSCOPIC-ADD ON     Status: None   Collection Time    01/29/13  2:59 PM      Result Value Range   Squamous Epithelial / LPF RARE  RARE   WBC, UA 0-2  <3 WBC/hpf   RBC / HPF 0-2  <3 RBC/hpf   Bacteria, UA RARE  RARE   Ct Abdomen Pelvis W Contrast  01/29/2013   *RADIOLOGY REPORT*  Clinical Data: Right lower quadrant pain.  Elevated white count.  CT ABDOMEN AND PELVIS WITH CONTRAST  Technique:  Multidetector CT imaging of the abdomen and pelvis was performed following the standard protocol during bolus administration of intravenous contrast.  Contrast: OMNIPAQUE IOHEXOL 300 MG/ML  SOLN  Comparison: 03/15/2007  Findings: Lung bases are clear.  No effusions.  Heart is normal size.  Liver, gallbladder, spleen, pancreas, adrenals and kidneys are normal.  The appendix is enlarged and inflamed.  Surrounding inflammatory change.  Appendicolith near the base of the appendix.  Findings compatible with acute appendicitis.  No free fluid or free air.  Stomach, large and small bowel are unremarkable.  Aorta is normal caliber.  No free fluid, free air or adenopathy.  No acute bony abnormality.  IMPRESSION: Dilated, inflamed appendix compatible with acute  appendicitis.   Original Report Authenticated By: Charlett Nose, M.D.    Review of Systems  Constitutional: Negative for fever and chills.  Gastrointestinal: Positive for nausea, vomiting and abdominal pain. Negative for diarrhea and constipation.  All other systems reviewed and are negative.    Blood pressure 142/75, pulse 75, temperature 97.8 F (36.6 C), temperature source Oral, resp. rate 18, SpO2 98.00%. Physical Exam  Constitutional: He is oriented to person, place, and time. He appears well-developed and well-nourished.  HENT:  Head: Normocephalic and atraumatic.  Eyes: Conjunctivae and EOM are normal. Pupils are equal, round, and reactive to light.  Neck: Normal range of motion. Neck supple.  Cardiovascular: Normal rate, regular rhythm and normal heart sounds.   Respiratory: Effort normal and breath sounds normal.  GI: Soft. Bowel sounds are normal. He exhibits no mass. There is tenderness. There is rebound, guarding and tenderness at McBurney's point.  +Rovsing's sign  Musculoskeletal: Normal range of motion.  Neurological: He is alert and oriented to person, place, and time. He has normal reflexes.  Skin: Skin is warm and dry.  Psychiatric: He has a normal mood and affect. His behavior is normal. Judgment and thought content normal.     Assessment/Plan Acute appendicitis  IV antibiotics preoperatively, then to the OR for lap appy.  Cherylynn Ridges 01/29/2013, 5:50 PM

## 2013-01-30 ENCOUNTER — Encounter (HOSPITAL_COMMUNITY): Payer: Self-pay | Admitting: *Deleted

## 2013-01-30 MED ORDER — HYDROCODONE-ACETAMINOPHEN 10-325 MG PO TABS: 1.0000 | ORAL_TABLET | ORAL | Status: DC | PRN

## 2013-01-30 MED ORDER — HYDROCODONE-ACETAMINOPHEN 10-325 MG PO TABS
1.0000 | ORAL_TABLET | ORAL | Status: DC | PRN
  Administered 2013-01-30: 2 via ORAL
  Filled 2013-01-30: qty 2

## 2013-01-30 MED ORDER — ACETAMINOPHEN 325 MG PO TABS: 650.0000 mg | ORAL_TABLET | Freq: Four times a day (QID) | ORAL | Status: DC | PRN

## 2013-01-30 MED ORDER — OXYCODONE-ACETAMINOPHEN 10-325 MG PO TABS: 1.0000 | ORAL_TABLET | ORAL | Status: DC | PRN

## 2013-01-30 NOTE — Progress Notes (Signed)
Patient ID: Roy Wilson, male   DOB: 09/17/1985, 27 y.o.   MRN: [id number] Complains of pain and itching with percocet, will give lunch today, change pain meds and see if can get discharged

## 2013-01-30 NOTE — Progress Notes (Signed)
No further pruritus, pain adequately controlled with hydrocodone/apap.  Pt ambulated, sitting by the window on the telephone and tablet, appears comfortable.  Stable for discharge.  I once again encouraged him to contact us with any questions or concerns.  Chenae Brager, ANP-BC

## 2013-01-30 NOTE — Progress Notes (Signed)
Pt discharged to home

## 2013-01-30 NOTE — Discharge Summary (Signed)
Physician Discharge Summary  Patient ID: Roy Wilson MRN: [id number] DOB/AGE: May 19, 1986 26 y.o.  Admit date: 01/29/2013 Discharge date: 01/30/2013  Admitting Diagnosis: Acute appendicitis without perforation  Discharge Diagnosis Acute appendicitis without perforation   Consultants none  Imaging: Ct Abdomen Pelvis W Contrast  01/29/2013   *RADIOLOGY REPORT*  Clinical Data: Right lower quadrant pain.  Elevated white count.  CT ABDOMEN AND PELVIS WITH CONTRAST  Technique:  Multidetector CT imaging of the abdomen and pelvis was performed following the standard protocol during bolus administration of intravenous contrast.  Contrast: OMNIPAQUE IOHEXOL 300 MG/ML  SOLN  Comparison: 03/15/2007  Findings: Lung bases are clear.  No effusions.  Heart is normal size.  Liver, gallbladder, spleen, pancreas, adrenals and kidneys are normal.  The appendix is enlarged and inflamed.  Surrounding inflammatory change.  Appendicolith near the base of the appendix.  Findings compatible with acute appendicitis.  No free fluid or free air.  Stomach, large and small bowel are unremarkable.  Aorta is normal caliber.  No free fluid, free air or adenopathy.  No acute bony abnormality.  IMPRESSION: Dilated, inflamed appendix compatible with acute appendicitis.   Original Report Authenticated By: Charlett Nose, M.D.    Procedures Laparoscopic appendectomy(Dr. Lindie Spruce 01/29/13)  Hospital Course:  Roy Wilson is a healthy 27 year old male who presented to [facility] with abdominal pain.  Workup showed acute appendicitis.  Patient was admitted and underwent procedure listed above.  Tolerated procedure well and was transferred to the floor.  Diet was advanced as tolerated.  On POD #2, the patient was voiding well, tolerating diet, ambulating well, pain well controlled, vital signs stable, incisions c/d/i and felt stable for discharge home. An appointment has been made on the patients behalf and he is aware of date and  time.  Physical Exam: General:  Alert, NAD, pleasant, comfortable Abd:  Soft, ND, mild tenderness, incisions C/D/I, drain with minimal sanguinous drainage    Medication List         acetaminophen 325 MG tablet  Commonly known as:  TYLENOL  Take 2 tablets (650 mg total) by mouth every 6 (six) hours as needed.     ibuprofen 600 MG tablet  Commonly known as:  ADVIL,MOTRIN  Take 1 tablet (600 mg total) by mouth every 6 (six) hours as needed for pain.     oxyCODONE-acetaminophen 10-325 MG per tablet  Commonly known as:  PERCOCET  Take 1 tablet by mouth every 4 (four) hours as needed for pain.     pantoprazole 40 MG tablet  Commonly known as:  PROTONIX  Take 40 mg by mouth daily.             Follow-up Information   Follow up with [facility] On 02/14/2013. (APPOINTMENT TIME: 10AM.  PLEASE ARRIVE PRIOR TO APPOINTMENT TIME)    Contact information:   [address]   [place] [address] [phone]       Signed: Ashok Norris, [facility] [phone]  01/30/2013, 8:07 AM

## 2013-01-31 ENCOUNTER — Encounter (HOSPITAL_COMMUNITY): Payer: Self-pay | Admitting: General Surgery

## 2013-02-01 ENCOUNTER — Encounter (INDEPENDENT_AMBULATORY_CARE_PROVIDER_SITE_OTHER): Payer: Self-pay | Admitting: Surgery

## 2013-02-06 ENCOUNTER — Telehealth (INDEPENDENT_AMBULATORY_CARE_PROVIDER_SITE_OTHER): Payer: Self-pay | Admitting: *Deleted

## 2013-02-06 NOTE — Telephone Encounter (Signed)
Patient called to ask for a refill of his pain medication.  Per protocol Norco 5/325mg  1 tablets every 4-6 hours as needed for pain #30 no refills called into [facility] [phone] at this time.  Patient states understanding.

## 2013-02-14 ENCOUNTER — Encounter (INDEPENDENT_AMBULATORY_CARE_PROVIDER_SITE_OTHER): Payer: Self-pay

## 2013-02-28 ENCOUNTER — Encounter (INDEPENDENT_AMBULATORY_CARE_PROVIDER_SITE_OTHER): Payer: Self-pay

## 2013-04-17 ENCOUNTER — Encounter (HOSPITAL_COMMUNITY): Payer: Self-pay | Admitting: Emergency Medicine

## 2013-04-17 ENCOUNTER — Emergency Department (HOSPITAL_COMMUNITY): Payer: BC Managed Care – PPO

## 2013-04-17 ENCOUNTER — Encounter: Payer: Self-pay | Admitting: Physician Assistant

## 2013-04-17 ENCOUNTER — Emergency Department (HOSPITAL_COMMUNITY)
Admission: EM | Admit: 2013-04-17 | Discharge: 2013-04-17 | Disposition: A | Discharge status: Home / Self Care | Payer: BC Managed Care – PPO | Attending: Emergency Medicine | Admitting: Emergency Medicine

## 2013-04-17 DIAGNOSIS — J40 Bronchitis, not specified as acute or chronic: Secondary | ICD-10-CM

## 2013-04-17 DIAGNOSIS — Z79899 Other long term (current) drug therapy: Secondary | ICD-10-CM | POA: Insufficient documentation

## 2013-04-17 DIAGNOSIS — R042 Hemoptysis: Secondary | ICD-10-CM

## 2013-04-17 DIAGNOSIS — F17200 Nicotine dependence, unspecified, uncomplicated: Secondary | ICD-10-CM | POA: Insufficient documentation

## 2013-04-17 LAB — CBC WITH DIFFERENTIAL/PLATELET
Basophils Absolute: 0 10*3/uL (ref 0.0–0.1)
Basophils Relative: 0 % (ref 0–1)
Eosinophils Relative: 3 % (ref 0–5)
HCT: 45.2 % (ref 39.0–52.0)
Hemoglobin: 15.5 g/dL (ref 13.0–17.0)
Lymphocytes Relative: 24 % (ref 12–46)
MCHC: 34.3 g/dL (ref 30.0–36.0)
Neutro Abs: 5.6 10*3/uL (ref 1.7–7.7)
Neutrophils Relative %: 65 % (ref 43–77)
Platelets: 262 10*3/uL (ref 150–400)
RDW: 13.3 % (ref 11.5–15.5)
WBC: 8.7 10*3/uL (ref 4.0–10.5)

## 2013-04-17 LAB — BASIC METABOLIC PANEL
CO2: 25 mEq/L (ref 19–32)
Chloride: 102 mEq/L (ref 96–112)
GFR calc Af Amer: >90 mL/min (ref >90)
Potassium: 3.8 mEq/L (ref 3.5–5.1)
Sodium: 139 mEq/L (ref 135–145)

## 2013-04-17 MED ORDER — ALBUTEROL SULFATE HFA 108 (90 BASE) MCG/ACT IN AERS
1.0000 | INHALATION_SPRAY | Freq: Once | RESPIRATORY_TRACT | Status: AC
  Administered 2013-04-17: 2 via RESPIRATORY_TRACT
  Filled 2013-04-17: qty 6.7

## 2013-04-17 MED ORDER — ALBUTEROL SULFATE HFA 108 (90 BASE) MCG/ACT IN AERS: 1.0000 | INHALATION_SPRAY | Freq: Four times a day (QID) | RESPIRATORY_TRACT | Status: DC | PRN

## 2013-04-17 MED ORDER — PREDNISONE 50 MG PO TABS: 50.0000 mg | ORAL_TABLET | Freq: Every day | ORAL | Status: DC

## 2013-04-17 NOTE — Telephone Encounter (Signed)
This encounter was created in error - please disregard.

## 2013-04-17 NOTE — Telephone Encounter (Signed)
Can you make erroneous please. Sorry! Thanks!

## 2013-04-17 NOTE — Progress Notes (Signed)
EDCM spoke to patient at bedside.  Patient is currently unemployed.  Patient reports he did have BCBS insurance in the past, but it became too expensive to keep.  Patient requesting help paying for his medications.  Patient has never been a part of the [facility] program.  Instructed patient that his prescriptions, prednisone and ventolin inhaler would cost him six dollars total.  Patient was agreeable to pay this amount.  Unfortunately, this EDCM unable to process MATCH request through pdmi at this time. This EDCM called [facility] and explained situation.  As per [facility], may send patient over with his [facility] letter and they will process the request even if they have to call pdmi.  [facility] provided patient with a list of pcps who accept self pay patients, information on Medicaid and Affordable Care Act for insurance, list of discounted pharmacies and website needymeds.org for medication assistance,  list of financial assistance in the community such as local churches and [employer].  EDCM also provided patient information on the orange card.  Patient reports he has had the orange card before.  EDCM will ask [facility] rep to send patient information regarding orange card.  EDCM also provided patient information regarding MAP program and explained he needed to find a pcp.  Patient verbalized understanding and is thankful for resources.  No firther needs at this time.

## 2013-04-17 NOTE — ED Provider Notes (Signed)
CSN: [id number]     Arrival date & time 04/17/13  1336 History   First MD Initiated Contact with Patient 04/17/13 1451     Chief Complaint  Patient presents with  . Cough  . Hemoptysis   (Consider location/radiation/quality/duration/timing/severity/associated sxs/prior Treatment) HPI Comments: Pt comes in with cc of cough. Pt has no medical hx, no hx of DVT, PE and no risk factors for the same. Pt states that he has been having some URI like symptoms x 3-4 days, which cleared y'day, however, starting today, he has been having some cough, and he had couple of episode of slight blood tinged in with the phlegm. There is no chest pain, dib - he thinks he has wheezed.   Patient is a 27 y.o. male presenting with cough. The history is provided by the patient.  Cough Associated symptoms: no chest pain and no shortness of breath     History reviewed. No pertinent past medical history. Past Surgical History  Procedure Laterality Date  . Femur fracture surgery  as child  . Laparoscopic appendectomy N/A 01/29/2013    Procedure: APPENDECTOMY LAPAROSCOPIC;  Surgeon: Cherylynn Ridges, MD;  Location: [facility] OR;  Service: General;  Laterality: N/A;   No family history on file. History  Substance Use Topics  . Smoking status: Current Every Day Smoker -- 0.50 packs/day for 7 years    Types: Cigarettes  . Smokeless tobacco: Former Neurosurgeon    Quit date: 11/06/2010  . Alcohol Use: Yes    Review of Systems  Constitutional: Negative for activity change and appetite change.  Respiratory: Positive for cough. Negative for chest tightness and shortness of breath.   Cardiovascular: Negative for chest pain.  Gastrointestinal: Negative for abdominal pain.  Genitourinary: Negative for dysuria.  Neurological: Negative for dizziness.    Allergies  Codeine  Home Medications   Current Outpatient Rx  Name  Route  Sig  Dispense  Refill  . pantoprazole (PROTONIX) 40 MG tablet   Oral   Take 40 mg by mouth daily as  needed (heart burn).          . Pseudoeph-Doxylamine-DM-APAP 60-12.12-23-998 MG/30ML LIQD   Oral   Take 30 mLs by mouth every 6 (six) hours as needed (cough).          BP 141/86  Pulse 62  Temp(Src) 98.2 F (36.8 C) (Oral)  Resp 17  SpO2 98% Physical Exam  Nursing note and vitals reviewed. Constitutional: He is oriented to person, place, and time. He appears well-developed.  HENT:  Head: Normocephalic and atraumatic.  Eyes: Conjunctivae and EOM are normal. Pupils are equal, round, and reactive to light.  Neck: Normal range of motion. Neck supple. No JVD present.  Cardiovascular: Normal rate and regular rhythm.   Pulmonary/Chest: Effort normal and breath sounds normal. No respiratory distress. He has no wheezes.  Abdominal: Soft. Bowel sounds are normal. He exhibits no distension. There is no tenderness. There is no rebound and no guarding.  Neurological: He is alert and oriented to person, place, and time.  Skin: Skin is warm.    ED Course  Procedures (including critical care time) Labs Review Labs Reviewed  CBC WITH DIFFERENTIAL  BASIC METABOLIC PANEL   Imaging Review Dg Chest 2 View  04/17/2013   CLINICAL DATA:  Left-sided chest pain. Short of breath. Cough.  EXAM: CHEST  2 VIEW  COMPARISON:  11/06/2011.  FINDINGS: The heart size and mediastinal contours are within normal limits. Both lungs are clear. The visualized  skeletal structures are unremarkable. Monitoring leads project over the chest.  IMPRESSION: No active cardiopulmonary disease.   Electronically Signed   By: Andreas Newport M.D.   On: 04/17/2013 15:15    MDM  No diagnosis found.  Pt comes in with cc of hemoptysis. He has no risk factors for PE, DVT, and his WELLS score and PERC score are negative -EXCEPT FOR THE HEMOPTYSIS. Clinically, i think the pretest probability for PE is extremely low, especially given URI like sx. Given he has hemoptysis, his dimer is likely to be elevated, and i think a CT PE -  risk outweighs the benefit. We discussed return precautions for PE. CXR is negative -will give albuterol and steroids as possible bronchitis.   Derwood Kaplan, MD 04/17/13 [phone]

## 2013-04-17 NOTE — ED Notes (Signed)
Pt c/o of cough x5 days. States that he started coughing up blood yesterday. Sputum was dark yellow color. Denies n/v/d.

## 2013-04-18 ENCOUNTER — Emergency Department (HOSPITAL_COMMUNITY)
Admission: EM | Admit: 2013-04-18 | Discharge: 2013-04-18 | Disposition: A | Discharge status: Home / Self Care | Payer: BC Managed Care – PPO | Attending: Emergency Medicine | Admitting: Emergency Medicine

## 2013-04-18 ENCOUNTER — Encounter (HOSPITAL_COMMUNITY): Payer: Self-pay | Admitting: Emergency Medicine

## 2013-04-18 DIAGNOSIS — J45909 Unspecified asthma, uncomplicated: Secondary | ICD-10-CM

## 2013-04-18 DIAGNOSIS — J45901 Unspecified asthma with (acute) exacerbation: Secondary | ICD-10-CM | POA: Insufficient documentation

## 2013-04-18 DIAGNOSIS — IMO0002 Reserved for concepts with insufficient information to code with codable children: Secondary | ICD-10-CM | POA: Insufficient documentation

## 2013-04-18 DIAGNOSIS — R079 Chest pain, unspecified: Secondary | ICD-10-CM | POA: Insufficient documentation

## 2013-04-18 DIAGNOSIS — Z79899 Other long term (current) drug therapy: Secondary | ICD-10-CM | POA: Insufficient documentation

## 2013-04-18 DIAGNOSIS — F17200 Nicotine dependence, unspecified, uncomplicated: Secondary | ICD-10-CM | POA: Insufficient documentation

## 2013-04-18 MED ORDER — AZITHROMYCIN 250 MG PO TABS: 250.0000 mg | ORAL_TABLET | Freq: Every day | ORAL | Status: DC

## 2013-04-18 MED ORDER — AZITHROMYCIN 250 MG PO TABS
500.0000 mg | ORAL_TABLET | Freq: Once | ORAL | Status: AC
  Administered 2013-04-18: 500 mg via ORAL
  Filled 2013-04-18: qty 2

## 2013-04-18 MED ORDER — SULFAMETHOXAZOLE-TRIMETHOPRIM 800-160 MG PO TABS: 1.0000 | ORAL_TABLET | Freq: Two times a day (BID) | ORAL | Status: DC

## 2013-04-18 NOTE — ED Notes (Signed)
Pt presents to ED via POV with c/o of chest pain, coughing up blood and SOB.  Pt has had bronchitis.

## 2013-04-18 NOTE — Progress Notes (Signed)
[facility] CL spoke with pt and provided him with a [employer] application. Made pt an apt for eligibility and enrollment for the [facility] card at [facility] on 9/29.

## 2013-04-21 NOTE — ED Provider Notes (Signed)
CSN: [id number]     Arrival date & time 04/18/13  1259 History   First MD Initiated Contact with Patient 04/18/13 1428     Chief Complaint  Patient presents with  . Hemoptysis  . Chest Pain    HPI   Pt presents to ED via POV with c/o of chest pain, coughing up blood and SOB. Pt has had bronchitis. Patient was seen yesterday and had a chest x-ray done.  Patient is a smoker.  His hemoptysis was preceded by a change in his sputum consistency from clear to a dark  and now has some blood streaks in it.  Patient's had no weight loss.  Patient was not started on antibiotics yesterday. History reviewed. No pertinent past medical history. Past Surgical History  Procedure Laterality Date  . Femur fracture surgery  as child  . Laparoscopic appendectomy N/A 01/29/2013    Procedure: APPENDECTOMY LAPAROSCOPIC;  Surgeon: Cherylynn Ridges, MD;  Location: [facility] OR;  Service: General;  Laterality: N/A;   History reviewed. No pertinent family history. History  Substance Use Topics  . Smoking status: Current Every Day Smoker -- 0.50 packs/day for 7 years    Types: Cigarettes  . Smokeless tobacco: Former Neurosurgeon    Quit date: 11/06/2010  . Alcohol Use: Yes    Review of Systems All other systems reviewed and are negative Allergies  Codeine  Home Medications   Current Outpatient Rx  Name  Route  Sig  Dispense  Refill  . pantoprazole (PROTONIX) 40 MG tablet   Oral   Take 40 mg by mouth daily as needed (heart burn).          . predniSONE (DELTASONE) 50 MG tablet   Oral   Take 1 tablet (50 mg total) by mouth daily.   5 tablet   0   . Pseudoeph-Doxylamine-DM-APAP 60-12.12-23-998 MG/30ML LIQD   Oral   Take 30 mLs by mouth every 6 (six) hours as needed (cough).         Marland Kitchen albuterol (PROVENTIL HFA;VENTOLIN HFA) 108 (90 BASE) MCG/ACT inhaler   Inhalation   Inhale 1-2 puffs into the lungs every 6 (six) hours as needed for wheezing.   1 Inhaler   0   . sulfamethoxazole-trimethoprim (SEPTRA DS)  800-160 MG per tablet   Oral   Take 1 tablet by mouth every 12 (twelve) hours.   20 tablet   0    BP 136/47  Pulse 52  Temp(Src) 98.2 F (36.8 C) (Oral)  Resp 18  SpO2 97% Physical Exam  Nursing note and vitals reviewed. Constitutional: He is oriented to person, place, and time. He appears well-developed and well-nourished. No distress.  HENT:  Head: Normocephalic and atraumatic.  Eyes: Pupils are equal, round, and reactive to light.  Neck: Normal range of motion.  Cardiovascular: Normal rate and intact distal pulses.   Pulmonary/Chest: No respiratory distress.  Abdominal: Normal appearance. He exhibits no distension.  Musculoskeletal: Normal range of motion.  Patient exhibits no leg swelling or calf tenderness.  Neurological: He is alert and oriented to person, place, and time. No cranial nerve deficit.  Skin: Skin is warm and dry. No rash noted.  Psychiatric: He has a normal mood and affect. His behavior is normal.    ED Course  Procedures (including critical care time)  Patient's history and physical exam support and acute bronchitis with small amounts of hemoptysis.  At this time we'll start oral antibiotics with instructions for the patient to return should he  develop shortness of breath or worsening symptoms. Labs Review Labs Reviewed - No data to display Imaging Review No results found.  MDM   1. Asthmatic bronchitis        Nelia Shi, MD 04/21/13 [phone]

## 2013-04-22 ENCOUNTER — Emergency Department (HOSPITAL_COMMUNITY): Payer: BC Managed Care – PPO

## 2013-04-22 ENCOUNTER — Encounter (HOSPITAL_COMMUNITY): Payer: Self-pay | Admitting: Emergency Medicine

## 2013-04-22 ENCOUNTER — Emergency Department (HOSPITAL_COMMUNITY)
Admission: EM | Admit: 2013-04-22 | Discharge: 2013-04-22 | Disposition: A | Discharge status: Home / Self Care | Payer: Self-pay | Attending: Emergency Medicine | Admitting: Emergency Medicine

## 2013-04-22 DIAGNOSIS — S20219A Contusion of unspecified front wall of thorax, initial encounter: Secondary | ICD-10-CM | POA: Insufficient documentation

## 2013-04-22 DIAGNOSIS — IMO0002 Reserved for concepts with insufficient information to code with codable children: Secondary | ICD-10-CM | POA: Insufficient documentation

## 2013-04-22 DIAGNOSIS — S20212A Contusion of left front wall of thorax, initial encounter: Secondary | ICD-10-CM

## 2013-04-22 DIAGNOSIS — F17200 Nicotine dependence, unspecified, uncomplicated: Secondary | ICD-10-CM | POA: Insufficient documentation

## 2013-04-22 DIAGNOSIS — S0993XA Unspecified injury of face, initial encounter: Secondary | ICD-10-CM | POA: Insufficient documentation

## 2013-04-22 DIAGNOSIS — Y9383 Activity, rough housing and horseplay: Secondary | ICD-10-CM | POA: Insufficient documentation

## 2013-04-22 DIAGNOSIS — S3981XA Other specified injuries of abdomen, initial encounter: Secondary | ICD-10-CM | POA: Insufficient documentation

## 2013-04-22 DIAGNOSIS — T07XXXA Unspecified multiple injuries, initial encounter: Secondary | ICD-10-CM

## 2013-04-22 DIAGNOSIS — Y9289 Other specified places as the place of occurrence of the external cause: Secondary | ICD-10-CM | POA: Insufficient documentation

## 2013-04-22 DIAGNOSIS — R0602 Shortness of breath: Secondary | ICD-10-CM | POA: Insufficient documentation

## 2013-04-22 MED ORDER — IBUPROFEN 800 MG PO TABS: 800.0000 mg | ORAL_TABLET | Freq: Three times a day (TID) | ORAL | Status: DC

## 2013-04-22 MED ORDER — SODIUM CHLORIDE 0.9 % IV BOLUS (SEPSIS)
1000.0000 mL | Freq: Once | INTRAVENOUS | Status: AC
  Administered 2013-04-22: 1000 mL via INTRAVENOUS

## 2013-04-22 MED ORDER — FENTANYL CITRATE 0.05 MG/ML IJ SOLN
50.0000 ug | Freq: Once | INTRAMUSCULAR | Status: AC
  Administered 2013-04-22: 100 ug via INTRAVENOUS
  Filled 2013-04-22: qty 2

## 2013-04-22 MED ORDER — METHOCARBAMOL 500 MG PO TABS: 500.0000 mg | ORAL_TABLET | Freq: Two times a day (BID) | ORAL | Status: DC

## 2013-04-22 MED ORDER — IOHEXOL 300 MG/ML  SOLN
100.0000 mL | Freq: Once | INTRAMUSCULAR | Status: AC | PRN
  Administered 2013-04-22: 100 mL via INTRAVENOUS

## 2013-04-22 NOTE — ED Provider Notes (Signed)
Medical screening examination/treatment/procedure(s) were performed by non-physician practitioner and as supervising physician I was immediately available for consultation/collaboration.  Fredis Malkiewicz T Jozlin Bently, MD 04/22/13 2315 

## 2013-04-22 NOTE — ED Notes (Signed)
Pt stated that was wrestlering with his friends last night while drinking and now c/o lt flank pain and some chest discomfort pt stated that it hurts when he breathe pox 98-100%

## 2013-04-22 NOTE — ED Provider Notes (Signed)
CSN: [id number]     Arrival date & time 04/22/13  1801 History   First MD Initiated Contact with Patient 04/22/13 1804     No chief complaint on file.  (Consider location/radiation/quality/duration/timing/severity/associated sxs/prior Treatment) HPI  27 year-old male presents for evaluations of left rib pain and left abdominal pain. Patient reports he was wrestling with his friends last night at a party. States he got body slammed a few times.  This morning he noticed significant pain to his left ribs and left abdomen. Pain is a sharp stabbing sensation worsening with palpation or movement, and with taking deep breath. Also endorsed body aches throughout. Denies any loss of consciousness but did report consuming some alcohol. Denies any new numbness or weakness. No complaint of dizziness or lightheadedness. Denies nausea vomiting or diarrhea. Denies any hemoptysis although he did have hemoptysis recently from bronchitis. Did try taking some aspirin this morning with minimal relief.  No past medical history on file. Past Surgical History  Procedure Laterality Date  . Femur fracture surgery  as child  . Laparoscopic appendectomy N/A 01/29/2013    Procedure: APPENDECTOMY LAPAROSCOPIC;  Surgeon: Cherylynn Ridges, MD;  Location: [facility] OR;  Service: General;  Laterality: N/A;   No family history on file. History  Substance Use Topics  . Smoking status: Current Every Day Smoker -- 0.50 packs/day for 7 years    Types: Cigarettes  . Smokeless tobacco: Former Neurosurgeon    Quit date: 11/06/2010  . Alcohol Use: Yes    Review of Systems  Constitutional: Negative for fever.  HENT: Positive for neck pain.   Respiratory: Positive for shortness of breath. Negative for wheezing.   Cardiovascular: Positive for chest pain.  Gastrointestinal: Positive for abdominal pain.  Musculoskeletal: Negative for back pain.  Skin: Negative for rash.  Neurological: Negative for numbness.  All other systems reviewed and are  negative.    Allergies  Codeine  Home Medications   Current Outpatient Rx  Name  Route  Sig  Dispense  Refill  . albuterol (PROVENTIL HFA;VENTOLIN HFA) 108 (90 BASE) MCG/ACT inhaler   Inhalation   Inhale 1-2 puffs into the lungs every 6 (six) hours as needed for wheezing.   1 Inhaler   0   . pantoprazole (PROTONIX) 40 MG tablet   Oral   Take 40 mg by mouth daily as needed (heart burn).          . predniSONE (DELTASONE) 50 MG tablet   Oral   Take 1 tablet (50 mg total) by mouth daily.   5 tablet   0   . Pseudoeph-Doxylamine-DM-APAP 60-12.12-23-998 MG/30ML LIQD   Oral   Take 30 mLs by mouth every 6 (six) hours as needed (cough).         Marland Kitchen sulfamethoxazole-trimethoprim (SEPTRA DS) 800-160 MG per tablet   Oral   Take 1 tablet by mouth every 12 (twelve) hours.   20 tablet   0    There were no vitals taken for this visit. Physical Exam  Nursing note and vitals reviewed. Constitutional: He is oriented to person, place, and time. He appears well-developed and well-nourished. No distress.  HENT:  Head: Atraumatic.  Eyes: Conjunctivae are normal.  Neck: Normal range of motion. Neck supple.  Pulmonary/Chest: He exhibits tenderness (Tenderness along left lateral aspects of lower ribs without any obvious crepitus, emphysema, or paradoxical chest movement.).  Abdominal: There is tenderness (left upper quadrant tenderness without guarding or rebound tenderness. No evidence of Cullen's sign).  Musculoskeletal:  He exhibits tenderness (Abrasion noted to bilateral elbows and bilateral knees with full range of motion to all major joints).  Tenderness to paracervical and parathoracic region without any significant midline spine tenderness, crepitus or step-off  Neurological: He is alert and oriented to person, place, and time.  Psychiatric: He has a normal mood and affect.    ED Course  Procedures (including critical care time)  6:45 PM Pt here with pain to L rib and L abd.   Will obtain xray of ribs to r/u rib fx.  Will obtain CT scan to r/o splenic lac.  Labs Review Labs Reviewed - No data to display Imaging Review Dg Ribs Unilateral W/chest Left  04/22/2013   CLINICAL DATA:  Left lateral rib pain since wrestling with friends yesterday, chest injury, flank pain  EXAM: LEFT RIBS AND CHEST - 3+ VIEW  COMPARISON:  Chest radiograph 04/22/2013  FINDINGS: Normal heart size, mediastinal contours, and pulmonary vascularity.  Minimal peribronchial thickening, chronic.  No acute infiltrate, pleural effusion, or pneumothorax.  Osseous mineralization normal.  No definite rib fracture or bone destruction.  Excreted contrast in renal collecting systems from preceding CT abdomen/pelvis.  IMPRESSION: No acute osseous abnormalities.   Electronically Signed   By: Ulyses Southward M.D.   On: 04/22/2013 19:28   Ct Abdomen Pelvis W Contrast  04/22/2013   CLINICAL DATA:  Patient complaining of left flank pain with some chest discomfort. This reportedly is from wrestling with friends last evening.  EXAM: CT ABDOMEN AND PELVIS WITH CONTRAST  TECHNIQUE: Multidetector CT imaging of the abdomen and pelvis was performed using the standard protocol following bolus administration of intravenous contrast.  CONTRAST:  OMNIPAQUE IOHEXOL 300 MG/ML  SOLN  COMPARISON:  01/29/2013  FINDINGS: Clear lung bases. The heart is normal in size.  Normal liver, spleen, gallbladder and pancreas. No bile duct dilation. No adrenal masses. Normal kidneys, ureters and bladder. No adenopathy. There are no abnormal fluid collections.  The bowel is unremarkable. A normal appendix is not seen. Surgical clips along the cecal tip suggested prior appendectomy.  No fracture or bony abnormality.  IMPRESSION: 1. No acute findings. 2. Findings consistent with prior appendectomy. 3. No other abnormalities.   Electronically Signed   By: Amie Portland   On: 04/22/2013 19:20    MDM   1. Rib contusion, left, initial encounter   2.  Abrasions of multiple sites    BP 150/81  Pulse 65  Temp(Src) 97.7 F (36.5 C) (Oral)  Resp 18  SpO2 97%  I have reviewed nursing notes and vital signs. I personally reviewed the imaging tests through PACS system  I reviewed available ER/hospitalization records thought the EMR     Fayrene Helper, PA-C 04/22/13 2002

## 2013-04-23 MED ORDER — IBUPROFEN 800 MG PO TABS: 800.0000 mg | ORAL_TABLET | Freq: Three times a day (TID) | ORAL | Status: DC

## 2013-04-23 MED ORDER — CYCLOBENZAPRINE HCL 10 MG PO TABS: 10.0000 mg | ORAL_TABLET | Freq: Two times a day (BID) | ORAL | Status: DC | PRN

## 2013-04-23 NOTE — ED Provider Notes (Signed)
Medical screening examination/treatment/procedure(s) were performed by non-physician practitioner and as supervising physician I was immediately available for consultation/collaboration.  Nhi Butrum L Damiel Barthold, MD 04/23/13 1552 

## 2013-04-23 NOTE — ED Provider Notes (Signed)
Patient presenting requesting to speak with case management regarding a prescription for Robaxin that he cannot afford. Case management not present, I will switch the Robaxin to Flexeril which is on the $4 list at [facility]. A new prescription was given for Flexeril and ibuprofen, the old prescription was shredded.  Trevor Mace, PA-C 04/23/13 1305

## 2013-04-23 NOTE — Care Management Note (Signed)
Cm spoke with patient visa telephone at [phone] concerning call from ED DEPT concerning pt's affordability of prescription medications. PA in ED able to provide pt with new rx for Flexeril which is on [facility] generic drug list. Pt no longer requiring Cm assistance for medications. No other barriers identified.    Roxy Manns Ghazal Pevey,RN,MSN [phone]

## 2013-04-24 ENCOUNTER — Ambulatory Visit: Payer: BC Managed Care – PPO

## 2013-05-01 ENCOUNTER — Ambulatory Visit: Payer: BC Managed Care – PPO

## 2013-05-11 ENCOUNTER — Emergency Department (HOSPITAL_COMMUNITY): Payer: BC Managed Care – PPO

## 2013-05-11 ENCOUNTER — Emergency Department (HOSPITAL_COMMUNITY)
Admission: EM | Admit: 2013-05-11 | Discharge: 2013-05-11 | Disposition: A | Discharge status: Home / Self Care | Payer: BC Managed Care – PPO | Attending: Emergency Medicine | Admitting: Emergency Medicine

## 2013-05-11 ENCOUNTER — Encounter (HOSPITAL_COMMUNITY): Payer: Self-pay | Admitting: Emergency Medicine

## 2013-05-11 DIAGNOSIS — J4489 Other specified chronic obstructive pulmonary disease: Secondary | ICD-10-CM | POA: Insufficient documentation

## 2013-05-11 DIAGNOSIS — J449 Chronic obstructive pulmonary disease, unspecified: Secondary | ICD-10-CM | POA: Insufficient documentation

## 2013-05-11 DIAGNOSIS — R002 Palpitations: Secondary | ICD-10-CM | POA: Insufficient documentation

## 2013-05-11 DIAGNOSIS — R0789 Other chest pain: Secondary | ICD-10-CM | POA: Insufficient documentation

## 2013-05-11 DIAGNOSIS — F419 Anxiety disorder, unspecified: Secondary | ICD-10-CM

## 2013-05-11 DIAGNOSIS — F411 Generalized anxiety disorder: Secondary | ICD-10-CM | POA: Insufficient documentation

## 2013-05-11 DIAGNOSIS — R079 Chest pain, unspecified: Secondary | ICD-10-CM

## 2013-05-11 DIAGNOSIS — Z79899 Other long term (current) drug therapy: Secondary | ICD-10-CM | POA: Insufficient documentation

## 2013-05-11 DIAGNOSIS — F17200 Nicotine dependence, unspecified, uncomplicated: Secondary | ICD-10-CM | POA: Insufficient documentation

## 2013-05-11 HISTORY — DX: Unspecified asthma, uncomplicated: J45.909

## 2013-05-11 HISTORY — DX: Chronic obstructive pulmonary disease, unspecified: J44.9

## 2013-05-11 LAB — BASIC METABOLIC PANEL
BUN: 6 mg/dL (ref 6–23)
CO2: 26 mEq/L (ref 19–32)
Chloride: 101 mEq/L (ref 96–112)
Creatinine, Ser: 0.82 mg/dL (ref 0.50–1.35)
Glucose, Bld: 105 mg/dL — ABNORMAL HIGH (ref 70–99)
Potassium: 4.2 mEq/L (ref 3.5–5.1)

## 2013-05-11 LAB — CBC
HCT: 45.7 % (ref 39.0–52.0)
Hemoglobin: 16 g/dL (ref 13.0–17.0)
MCV: 88.2 fL (ref 78.0–100.0)
RBC: 5.18 MIL/uL (ref 4.22–5.81)
WBC: 8.9 10*3/uL (ref 4.0–10.5)

## 2013-05-11 LAB — POCT I-STAT TROPONIN I: Troponin i, poc: 0 ng/mL (ref 0.00–0.08)

## 2013-05-11 MED ORDER — LORAZEPAM 1 MG PO TABS: 1.0000 mg | ORAL_TABLET | Freq: Three times a day (TID) | ORAL | Status: DC | PRN

## 2013-05-11 MED ORDER — ALPRAZOLAM 0.5 MG PO TABS
0.5000 mg | ORAL_TABLET | Freq: Once | ORAL | Status: AC
  Administered 2013-05-11: 0.5 mg via ORAL
  Filled 2013-05-11: qty 1

## 2013-05-11 NOTE — ED Notes (Signed)
Pt complains of racing heart, chest pain, dizziness and sob for past 5 days. Pt states heart feels like it is racing right now, HR 65. Pt states chest pain starts in middle of night leading to sob.  Lungs clear and equal.

## 2013-05-11 NOTE — ED Notes (Signed)
Pt has urinal at bedside 

## 2013-05-11 NOTE — ED Notes (Signed)
Pt states he fell while walking down hallway to room and hit head

## 2013-05-11 NOTE — Progress Notes (Signed)
[facility] CL spoke with patient about the [facility] on last ED visit on 9/23. Set patient up with an apt at that time for eligibility and enrollment on 9/29 at [facility]. Spoke with patient again today about [facility] and pt stated that he could not make the appointment for enrollment. Left another application with patient and provided him with a list of primary care resources.

## 2013-05-11 NOTE — ED Provider Notes (Signed)
CSN: [id number]     Arrival date & time 05/11/13  1326 History   First MD Initiated Contact with Patient 05/11/13 1331     Chief Complaint  Patient presents with  . Dizziness  . Shortness of Breath  . Chest Pain   (Consider location/radiation/quality/duration/timing/severity/associated sxs/prior Treatment) HPI Pt c/o palpitations for the past couple days, constant. States feels as if heart racing. At rest. No specific exacerbating or alleviating factors. No hx chd, cad, chf, or dysrhythmia. Pt states cp for past couple days also, constant, dull, diffuse, at rest. No change w activity, exertion, or positional changes. Occasional non prod cough. No fever or chills. No recent febrile/viral illness. No leg pain or swelling, no immobility, trauma, surgery, or hx dvt or pe. +smoker. Denies cocaine use. Denies hx thyroid dis, no heat intolerance, sweats, or wt change. Moderate caffeine use, no energy drink or stimulant use.     Past Medical History  Diagnosis Date  . Asthma   . COPD (chronic obstructive pulmonary disease)    Past Surgical History  Procedure Laterality Date  . Femur fracture surgery  as child  . Laparoscopic appendectomy N/A 01/29/2013    Procedure: APPENDECTOMY LAPAROSCOPIC;  Surgeon: Cherylynn Ridges, MD;  Location: [facility] OR;  Service: General;  Laterality: N/A;   History reviewed. No pertinent family history. History  Substance Use Topics  . Smoking status: Current Every Day Smoker -- 0.50 packs/day for 7 years    Types: Cigarettes  . Smokeless tobacco: Former Neurosurgeon    Quit date: 11/06/2010  . Alcohol Use: Yes    Review of Systems  Constitutional: Negative for fever.  Eyes: Negative for redness.  Respiratory: Positive for cough.   Cardiovascular: Positive for palpitations. Negative for leg swelling.  Gastrointestinal: Negative for abdominal pain.  Genitourinary: Negative for flank pain.  Musculoskeletal: Negative for back pain and neck pain.  Skin: Negative for rash.   Neurological: Negative for headaches.  Hematological: Does not bruise/bleed easily.  Psychiatric/Behavioral: Negative for confusion. The patient is nervous/anxious.     Allergies  Codeine  Home Medications   Current Outpatient Rx  Name  Route  Sig  Dispense  Refill  . albuterol (PROVENTIL HFA;VENTOLIN HFA) 108 (90 BASE) MCG/ACT inhaler   Inhalation   Inhale 1-2 puffs into the lungs every 6 (six) hours as needed for wheezing.   1 Inhaler   0   . pantoprazole (PROTONIX) 40 MG tablet   Oral   Take 40 mg by mouth daily as needed (heart burn).           BP 146/88  Pulse 66  Temp(Src) 98.7 F (37.1 C) (Oral)  Resp 20  SpO2 100% Physical Exam  Nursing note and vitals reviewed. Constitutional: He is oriented to person, place, and time. He appears well-developed and well-nourished. No distress.  HENT:  Nose: Nose normal.  Mouth/Throat: Oropharynx is clear and moist.  Eyes: Conjunctivae are normal. No scleral icterus.  Neck: Neck supple. No JVD present. No tracheal deviation present.  Cardiovascular: Normal rate, regular rhythm, normal heart sounds and intact distal pulses.  Exam reveals no gallop and no friction rub.   No murmur heard. Pulmonary/Chest: Effort normal and breath sounds normal. No accessory muscle usage. No respiratory distress.  Abdominal: Soft. Bowel sounds are normal. He exhibits no distension. There is no tenderness.  Musculoskeletal: Normal range of motion. He exhibits no edema and no tenderness.  Neurological: He is alert and oriented to person, place, and time.  Skin:  Skin is warm and dry.  Psychiatric:  anxious    ED Course  Procedures (including critical care time) Labs Review   Results for orders placed during the hospital encounter of 05/11/13  CBC      Result Value Range   WBC 8.9  4.0 - 10.5 K/uL   RBC 5.18  4.22 - 5.81 MIL/uL   Hemoglobin 16.0  13.0 - 17.0 g/dL   HCT 16.1  09.6 - 04.5 %   MCV 88.2  78.0 - 100.0 fL   MCH 30.9  26.0 -  34.0 pg   MCHC 35.0  30.0 - 36.0 g/dL   RDW 40.9  81.1 - 91.4 %   Platelets 290  150 - 400 K/uL  BASIC METABOLIC PANEL      Result Value Range   Sodium 137  135 - 145 mEq/L   Potassium 4.2  3.5 - 5.1 mEq/L   Chloride 101  96 - 112 mEq/L   CO2 26  19 - 32 mEq/L   Glucose, Bld 105 (*) 70 - 99 mg/dL   BUN 6  6 - 23 mg/dL   Creatinine, Ser 7.82  0.50 - 1.35 mg/dL   Calcium 95.6  8.4 - 21.3 mg/dL   GFR calc non Af Amer >90  >90 mL/min   GFR calc Af Amer >90  >90 mL/min  POCT I-STAT TROPONIN I      Result Value Range   Troponin i, poc 0.00  0.00 - 0.08 ng/mL   Comment 3            Dg Chest 2 View  05/11/2013   CLINICAL DATA:  Left-sided chest pain, shortness of breath  EXAM: CHEST  2 VIEW  COMPARISON:  04/22/2013  FINDINGS: The heart size and mediastinal contours are within normal limits. Both lungs are clear. The visualized skeletal structures are unremarkable.  IMPRESSION: No active cardiopulmonary disease.   Electronically Signed   By: Alcide Clever M.D.   On: 05/11/2013 14:15     EKG Interpretation     Ventricular Rate:  65 PR Interval:  128 QRS Duration: 98 QT Interval:  391 QTC Calculation: 407 R Axis:   68 Text Interpretation:  Sinus rhythm Baseline wander in lead(s) V2            MDM  Monitor. Ecg.  Cxr.  Pt very anxious, offered reassurance, xanax po for symptom relief.  Reviewed nursing notes and prior charts for additional history.     Date: 05/11/2013  Rate: 65  Rhythm: normal sinus rhythm  QRS Axis: normal  Intervals: normal  ST/T Wave abnormalities: normal  Conduction Disutrbances:none  Narrative Interpretation:   Old EKG Reviewed: unchanged   Recheck hr 70, rr 16, pulse ox 100%, remains in nsr, no ectopy or dysrhythmia on monitor. Pt calm and alert. Appears stable for d/c.     Suzi Roots, MD 05/11/13 [phone]

## 2013-05-18 ENCOUNTER — Emergency Department (HOSPITAL_COMMUNITY)
Admission: EM | Admit: 2013-05-18 | Discharge: 2013-05-18 | Disposition: A | Discharge status: Home / Self Care | Payer: BC Managed Care – PPO | Attending: Emergency Medicine | Admitting: Emergency Medicine

## 2013-05-18 ENCOUNTER — Encounter (HOSPITAL_COMMUNITY): Payer: Self-pay | Admitting: Emergency Medicine

## 2013-05-18 ENCOUNTER — Other Ambulatory Visit: Payer: Self-pay

## 2013-05-18 DIAGNOSIS — S335XXA Sprain of ligaments of lumbar spine, initial encounter: Secondary | ICD-10-CM | POA: Insufficient documentation

## 2013-05-18 DIAGNOSIS — R002 Palpitations: Secondary | ICD-10-CM | POA: Insufficient documentation

## 2013-05-18 DIAGNOSIS — S239XXA Sprain of unspecified parts of thorax, initial encounter: Secondary | ICD-10-CM | POA: Insufficient documentation

## 2013-05-18 DIAGNOSIS — T148XXA Other injury of unspecified body region, initial encounter: Secondary | ICD-10-CM

## 2013-05-18 DIAGNOSIS — J4489 Other specified chronic obstructive pulmonary disease: Secondary | ICD-10-CM | POA: Insufficient documentation

## 2013-05-18 DIAGNOSIS — Z79899 Other long term (current) drug therapy: Secondary | ICD-10-CM | POA: Insufficient documentation

## 2013-05-18 DIAGNOSIS — J449 Chronic obstructive pulmonary disease, unspecified: Secondary | ICD-10-CM | POA: Insufficient documentation

## 2013-05-18 DIAGNOSIS — Y9389 Activity, other specified: Secondary | ICD-10-CM | POA: Insufficient documentation

## 2013-05-18 DIAGNOSIS — X503XXA Overexertion from repetitive movements, initial encounter: Secondary | ICD-10-CM | POA: Insufficient documentation

## 2013-05-18 DIAGNOSIS — F17200 Nicotine dependence, unspecified, uncomplicated: Secondary | ICD-10-CM | POA: Insufficient documentation

## 2013-05-18 DIAGNOSIS — Y929 Unspecified place or not applicable: Secondary | ICD-10-CM | POA: Insufficient documentation

## 2013-05-18 DIAGNOSIS — S298XXA Other specified injuries of thorax, initial encounter: Secondary | ICD-10-CM | POA: Insufficient documentation

## 2013-05-18 LAB — CBC
HCT: 45.9 % (ref 39.0–52.0)
Hemoglobin: 15.7 g/dL (ref 13.0–17.0)
Platelets: 248 10*3/uL (ref 150–400)
RDW: 13.5 % (ref 11.5–15.5)
WBC: 5.2 10*3/uL (ref 4.0–10.5)

## 2013-05-18 LAB — POCT I-STAT, CHEM 8
Calcium, Ion: 1.21 mmol/L (ref 1.12–1.23)
HCT: 47 % (ref 39.0–52.0)
Hemoglobin: 16 g/dL (ref 13.0–17.0)
Potassium: 4 mEq/L (ref 3.5–5.1)
Sodium: 141 mEq/L (ref 135–145)

## 2013-05-18 LAB — POCT I-STAT TROPONIN I: Troponin i, poc: 0 ng/mL (ref 0.00–0.08)

## 2013-05-18 LAB — CK: Total CK: 110 U/L (ref 7–232)

## 2013-05-18 MED ORDER — METHOCARBAMOL 500 MG PO TABS: 1000.0000 mg | ORAL_TABLET | Freq: Four times a day (QID) | ORAL | Status: DC

## 2013-05-18 MED ORDER — SODIUM CHLORIDE 0.9 % IV BOLUS (SEPSIS)
1000.0000 mL | Freq: Once | INTRAVENOUS | Status: AC
  Administered 2013-05-18: 1000 mL via INTRAVENOUS

## 2013-05-18 MED ORDER — NAPROXEN 500 MG PO TABS: 500.0000 mg | ORAL_TABLET | Freq: Two times a day (BID) | ORAL | Status: DC

## 2013-05-18 MED ORDER — TRAMADOL HCL 50 MG PO TABS: 50.0000 mg | ORAL_TABLET | Freq: Four times a day (QID) | ORAL | Status: DC | PRN

## 2013-05-18 NOTE — ED Provider Notes (Signed)
Medical screening examination/treatment/procedure(s) were performed by non-physician practitioner and as supervising physician I was immediately available for consultation/collaboration.  EKG Interpretation   None         Kristen N Ward, DO 05/18/13 2012 

## 2013-05-18 NOTE — ED Provider Notes (Signed)
CSN: [id number]     Arrival date & time 05/18/13  1225 History   First MD Initiated Contact with Patient 05/18/13 1303     Chief Complaint  Patient presents with  . Muscle Pain   (Consider location/radiation/quality/duration/timing/severity/associated sxs/prior Treatment) HPI Comments: Patient presents with complaint of diffuse myalgias, worse in his left lower and middle back that began after he mowed grass 2 days ago. Patient states that he was using a push mower up and down hills, mowed for approximately 3 and half hours, and the work was very strenuous. He states that he hydrated well. He is uncomfortable with any movement or position. He took ibuprofen without relief. Patient also admits to having palpitations with associated chest pain every morning for a long period of time. Sometimes she will wake up at night with these symptoms. Last experienced this this morning. No shortness of breath, nausea or vomiting. No family history of early CAD. Patient smokes 3-4 cigarettes per day and is trying to cut back because of bronchitis. Onset of symptoms gradual. Course is constant. Pain is worse with motion. Nothing makes symptoms better.  Patient is a 27 y.o. male presenting with musculoskeletal pain. The history is provided by the patient.  Muscle Pain Associated symptoms include chest pain and myalgias. Pertinent negatives include no abdominal pain, coughing, diaphoresis, fever, nausea, neck pain, rash or vomiting.    Past Medical History  Diagnosis Date  . Asthma   . COPD (chronic obstructive pulmonary disease)    Past Surgical History  Procedure Laterality Date  . Femur fracture surgery  as child  . Laparoscopic appendectomy N/A 01/29/2013    Procedure: APPENDECTOMY LAPAROSCOPIC;  Surgeon: Cherylynn Ridges, MD;  Location: [facility] OR;  Service: General;  Laterality: N/A;   No family history on file. History  Substance Use Topics  . Smoking status: Current Every Day Smoker -- 0.50 packs/day for 7  years    Types: Cigarettes  . Smokeless tobacco: Former Neurosurgeon    Quit date: 11/06/2010  . Alcohol Use: Yes    Review of Systems  Constitutional: Negative for fever and diaphoresis.  Eyes: Negative for redness.  Respiratory: Negative for cough and shortness of breath.   Cardiovascular: Positive for chest pain and palpitations. Negative for leg swelling.  Gastrointestinal: Negative for nausea, vomiting and abdominal pain.  Genitourinary: Negative for dysuria.  Musculoskeletal: Positive for back pain and myalgias. Negative for neck pain.  Skin: Negative for rash.  Neurological: Negative for syncope and light-headedness.    Allergies  Codeine  Home Medications   Current Outpatient Rx  Name  Route  Sig  Dispense  Refill  . albuterol (PROVENTIL HFA;VENTOLIN HFA) 108 (90 BASE) MCG/ACT inhaler   Inhalation   Inhale 1-2 puffs into the lungs every 6 (six) hours as needed for wheezing.   1 Inhaler   0   . pantoprazole (PROTONIX) 40 MG tablet   Oral   Take 40 mg by mouth daily as needed (heart burn).           BP 140/99  Pulse 99  Temp(Src) 98.4 F (36.9 C) (Oral)  Resp 18  Ht 5\' 9"  (1.753 m)  Wt 213 lb 6 oz (96.786 kg)  BMI 31.5 kg/m2  SpO2 98% Physical Exam  Nursing note and vitals reviewed. Constitutional: He appears well-developed and well-nourished.  HENT:  Head: Normocephalic and atraumatic.  Mouth/Throat: Mucous membranes are normal. Mucous membranes are not dry.  Eyes: Conjunctivae are normal.  Neck: Trachea normal and  normal range of motion. Neck supple. Normal carotid pulses and no JVD present. No muscular tenderness present. Carotid bruit is not present. No tracheal deviation present.  Cardiovascular: Normal rate, regular rhythm, S1 normal, S2 normal, normal heart sounds and intact distal pulses.  Exam reveals no distant heart sounds and no decreased pulses.   No murmur heard. Pulmonary/Chest: Effort normal and breath sounds normal. No respiratory distress. He  has no wheezes. He exhibits no tenderness.  Abdominal: Soft. Normal aorta and bowel sounds are normal. There is no tenderness. There is no rebound and no guarding.  Musculoskeletal: He exhibits tenderness (generalized). He exhibits no edema.       Cervical back: Normal.       Thoracic back: He exhibits tenderness. He exhibits normal range of motion and no bony tenderness.       Lumbar back: He exhibits tenderness. He exhibits normal range of motion and no bony tenderness.       Back:  Neurological: He is alert.  Skin: Skin is warm and dry. He is not diaphoretic. No cyanosis. No pallor.  Psychiatric: He has a normal mood and affect.    ED Course  Procedures (including critical care time) Labs Review Labs Reviewed  CBC  CK  POCT I-STAT, CHEM 8  POCT I-STAT TROPONIN I   Imaging Review No results found.  EKG Interpretation   None      3:04 PM Patient seen and examined. Work-up initiated.    Vital signs reviewed and are as follows: Filed Vitals:   05/18/13 1228  BP: 140/99  Pulse: 99  Temp: 98.4 F (36.9 C)  Resp: 18   4:49 PM Labs reviewed. Pt informed of results. He has received 1000cc NS.   Patient urged to return with worsening symptoms or other concerns. Urged to rest and hydrate well. Patient verbalized understanding and agrees with plan.   Patient counseled on use of narcotic pain medications. Counseled not to combine these medications with others containing tylenol. Urged not to drink alcohol, drive, or perform any other activities that requires focus while taking these medications. The patient verbalizes understanding and agrees with the plan.  Patient counseled on proper use of muscle relaxant medication.  They were told not to drink alcohol, drive any vehicle, or do any dangerous activities while taking this medication.  Patient verbalized understanding.   Date: 05/18/2013  Rate: 75  Rhythm: normal sinus rhythm  QRS Axis: normal  Intervals: normal  ST/T  Wave abnormalities: normal  Conduction Disutrbances:none  Narrative Interpretation:   Old EKG Reviewed: none available   MDM   1. Muscle strain    Patient with myalgias, back pain after strenuous activity. This is his main complaint. CK normal, labs normal. He has intermittent CP/palpitations. Work-up neg. Patient low risk for ACS. Do not suspect pneumothorax or other emergent etiology. Feel these sx can be worked up as outpatient. PCP referrals given.     Renne Crigler, PA-C 05/18/13 1654

## 2013-05-18 NOTE — ED Provider Notes (Signed)
Medical screening examination/treatment/procedure(s) were performed by non-physician practitioner and as supervising physician I was immediately available for consultation/collaboration.  EKG Interpretation   None         Richardean Canal, MD 05/18/13 1843

## 2013-05-18 NOTE — ED Provider Notes (Signed)
JEREMIAN WHITBY is a 27 y/o M with a h/o COPD who presents to the Emergency Department complaining of sudden, moderate, constant, generalized body aching, onset 2 days ago while he was having trouble operating machinery trying to mow a friend's yard. He states that he has painful muscle spasms in his back around the left side, especially. He reports associated neck stiffness, and a slight headache. He denies any recent falls or injuries. He also reports heart palpitations, onset 2 weeks ago with more frequent episodes of worsening episodes this past week. He reports it keeps him up sometimes at night. He reports associated intermittent, chest pain, onset 1 week ago, but denies the pain radiating down his left arm - stated that when he has chest pain is it a shooting pain that localizes to the left side of his chest. Reported that he did experience left arm numbness a week ago. He denies recent fevers, SOB, difficulty breathing, numbness, tingling, bladder and bowel incontinence, chills, congestion, blurred vision, and any other associated symptoms. He reports being a current every day smoker, .5 PPD for 7 years. He reports his father has h/o COPD.  He denies ever being checked out for his heart palpitations.  Patient does not qualify for fast track. Patient to be moved to main ED for further work-up to be performed. Orders placed. Discussed with patient with plan to move to main ED, patient understood and agreed. Patient stable for transfer.    Raymon Mutton, PA-C 05/18/13 1753

## 2013-05-18 NOTE — ED Notes (Signed)
Pt reports continued 10/10 pain. Provider made aware. Pt in NAD, ambulates independently with no noted difficulty.

## 2013-05-18 NOTE — ED Notes (Signed)
Pt states that he helped someone mow grass 2 days ago and now he is aching all over and esp back left side spasms

## 2013-05-27 ENCOUNTER — Emergency Department (HOSPITAL_COMMUNITY): Payer: BC Managed Care – PPO

## 2013-05-27 ENCOUNTER — Encounter (HOSPITAL_COMMUNITY): Payer: Self-pay | Admitting: Emergency Medicine

## 2013-05-27 ENCOUNTER — Emergency Department (HOSPITAL_COMMUNITY)
Admission: EM | Admit: 2013-05-27 | Discharge: 2013-05-27 | Disposition: A | Discharge status: Home / Self Care | Payer: BC Managed Care – PPO | Attending: Emergency Medicine | Admitting: Emergency Medicine

## 2013-05-27 DIAGNOSIS — J441 Chronic obstructive pulmonary disease with (acute) exacerbation: Secondary | ICD-10-CM | POA: Insufficient documentation

## 2013-05-27 DIAGNOSIS — R209 Unspecified disturbances of skin sensation: Secondary | ICD-10-CM | POA: Insufficient documentation

## 2013-05-27 DIAGNOSIS — Z791 Long term (current) use of non-steroidal anti-inflammatories (NSAID): Secondary | ICD-10-CM | POA: Insufficient documentation

## 2013-05-27 DIAGNOSIS — Z79899 Other long term (current) drug therapy: Secondary | ICD-10-CM | POA: Insufficient documentation

## 2013-05-27 DIAGNOSIS — R079 Chest pain, unspecified: Secondary | ICD-10-CM

## 2013-05-27 DIAGNOSIS — F17200 Nicotine dependence, unspecified, uncomplicated: Secondary | ICD-10-CM | POA: Insufficient documentation

## 2013-05-27 LAB — CBC WITH DIFFERENTIAL/PLATELET
Basophils Relative: 0 % (ref 0–1)
Eosinophils Absolute: 0.1 10*3/uL (ref 0.0–0.7)
Hemoglobin: 15.1 g/dL (ref 13.0–17.0)
Lymphocytes Relative: 36 % (ref 12–46)
MCH: 31.2 pg (ref 26.0–34.0)
MCHC: 35 g/dL (ref 30.0–36.0)
Monocytes Relative: 6 % (ref 3–12)
Neutro Abs: 4.6 10*3/uL (ref 1.7–7.7)
Neutrophils Relative %: 56 % (ref 43–77)
RDW: 13.2 % (ref 11.5–15.5)
WBC: 8.1 10*3/uL (ref 4.0–10.5)

## 2013-05-27 LAB — BASIC METABOLIC PANEL
BUN: 8 mg/dL (ref 6–23)
Creatinine, Ser: 0.72 mg/dL (ref 0.50–1.35)
GFR calc Af Amer: >90 mL/min (ref >90)
GFR calc non Af Amer: >90 mL/min (ref >90)
Potassium: 3.4 mEq/L — ABNORMAL LOW (ref 3.5–5.1)

## 2013-05-27 LAB — POCT I-STAT TROPONIN I: Troponin i, poc: 0 ng/mL (ref 0.00–0.08)

## 2013-05-27 MED ORDER — ALBUTEROL SULFATE HFA 108 (90 BASE) MCG/ACT IN AERS: 2.0000 | INHALATION_SPRAY | RESPIRATORY_TRACT | Status: DC | PRN

## 2013-05-27 MED ORDER — IBUPROFEN 800 MG PO TABS
800.0000 mg | ORAL_TABLET | Freq: Once | ORAL | Status: AC
  Administered 2013-05-27: 800 mg via ORAL
  Filled 2013-05-27: qty 1

## 2013-05-27 NOTE — ED Provider Notes (Signed)
CSN: [id number]     Arrival date & time 05/27/13  1702 History   First MD Initiated Contact with Patient 05/27/13 1738     Chief Complaint  Patient presents with  . Chest Pain    HPI  Roy Wilson is a 27 y.o. male with a PMH of asthma and COPD who presents to the ED for evaluation of chest pain.  History was provided by the patient.  Patient states that he has had intermittent chest pain for the past two months.  He states the frequency and severity of the chest pain is increasing.  His chest pain varies in duration.  He last developed chest pain at 8:00 am this morning.  His chest pain is located in the left chest diffusely with radiation down the left arm.  He states his pain was a sharp pain but is now a "numbness in my chest" and it feels like my "heart is leaking warm fluid."  He states that he has had intermittent SOB.  He states that he has SOB at rest and with exertion.  He denies this currently.  He has a chronic productive cough.  He has gray sputum production.  No hemoptysis.  He is currently a smoker.  No fevers, chills, change in appetite/activity, rhinorrhea, congestion, abdominal pain, nausea, emesis, diarrhea, constipation, leg edema/calf tenderness, headache, dizziness or lightheadedness.  No history of cardiac disease/clotting/DVT/PE.  Surgery in 01/2013 for appendectomy otherwise no other surgeries.  No strong hx of cardiac disease that he is aware of.  No cocaine/other drug use.     Past Medical History  Diagnosis Date  . Asthma   . COPD (chronic obstructive pulmonary disease)    Past Surgical History  Procedure Laterality Date  . Femur fracture surgery  as child  . Laparoscopic appendectomy N/A 01/29/2013    Procedure: APPENDECTOMY LAPAROSCOPIC;  Surgeon: Cherylynn Ridges, MD;  Location: [facility] OR;  Service: General;  Laterality: N/A;   History reviewed. No pertinent family history. History  Substance Use Topics  . Smoking status: Current Every Day Smoker -- 0.50 packs/day  for 7 years    Types: Cigarettes  . Smokeless tobacco: Former Neurosurgeon    Quit date: 11/06/2010  . Alcohol Use: Yes    Review of Systems  Constitutional: Negative for fever, chills, activity change, appetite change and fatigue.  HENT: Negative for congestion, rhinorrhea and sore throat.   Respiratory: Positive for cough and shortness of breath. Negative for chest tightness and wheezing.   Cardiovascular: Positive for chest pain. Negative for palpitations and leg swelling.  Gastrointestinal: Negative for nausea, vomiting, abdominal pain and diarrhea.  Genitourinary: Negative for dysuria.  Musculoskeletal: Negative for back pain.  Skin: Negative for wound.  Neurological: Negative for dizziness, syncope, weakness, light-headedness and headaches.    Allergies  Codeine  Home Medications   Current Outpatient Rx  Name  Route  Sig  Dispense  Refill  . albuterol (PROVENTIL HFA;VENTOLIN HFA) 108 (90 BASE) MCG/ACT inhaler   Inhalation   Inhale 1-2 puffs into the lungs every 6 (six) hours as needed for wheezing.   1 Inhaler   0   . methocarbamol (ROBAXIN) 500 MG tablet   Oral   Take 2 tablets (1,000 mg total) by mouth 4 (four) times daily.   20 tablet   0   . naproxen (NAPROSYN) 500 MG tablet   Oral   Take 1 tablet (500 mg total) by mouth 2 (two) times daily.   20 tablet  0   . pantoprazole (PROTONIX) 40 MG tablet   Oral   Take 40 mg by mouth daily as needed (heart burn).          . traMADol (ULTRAM) 50 MG tablet   Oral   Take 1 tablet (50 mg total) by mouth every 6 (six) hours as needed for pain.   10 tablet   0    BP 130/65  Pulse 65  Temp(Src) 98.9 F (37.2 C) (Oral)  Resp 21  SpO2 97%  Filed Vitals:   05/27/13 1712 05/27/13 1941 05/27/13 2000  BP: 130/65 130/63 130/63  Pulse: 65 68 58  Temp: 98.9 F (37.2 C) 98.3 F (36.8 C) 98 F (36.7 C)  TempSrc: Oral Oral Oral  Resp: 21 22 23   SpO2: 97% 99% 98%    Physical Exam  Nursing note and vitals  reviewed. Constitutional: He is oriented to person, place, and time. He appears well-developed and well-nourished. No distress.  HENT:  Head: Normocephalic and atraumatic.  Right Ear: External ear normal.  Left Ear: External ear normal.  Nose: Nose normal.  Mouth/Throat: Oropharynx is clear and moist.  Eyes: Conjunctivae are normal. Right eye exhibits no discharge. Left eye exhibits no discharge.  Neck: Normal range of motion. Neck supple.  Cardiovascular: Normal rate, regular rhythm, normal heart sounds and intact distal pulses.  Exam reveals no gallop and no friction rub.   No murmur heard. Pulmonary/Chest: Effort normal and breath sounds normal. No respiratory distress. He has no wheezes. He has no rales. He exhibits no tenderness.  Abdominal: Soft. He exhibits no distension and no mass. There is no tenderness. There is no rebound and no guarding.  Musculoskeletal: Normal range of motion. He exhibits no edema and no tenderness.  No pedal edema or calf tenderness bilaterally  Neurological: He is alert and oriented to person, place, and time.  Skin: Skin is warm and dry. He is not diaphoretic.     ED Course  Procedures (including critical care time) Labs Review Labs Reviewed  POCT I-STAT TROPONIN I   Imaging Review No results found.  EKG Interpretation   None       Results for orders placed during the hospital encounter of 05/27/13  CBC WITH DIFFERENTIAL      Result Value Range   WBC 8.1  4.0 - 10.5 K/uL   RBC 4.84  4.22 - 5.81 MIL/uL   Hemoglobin 15.1  13.0 - 17.0 g/dL   HCT 16.1  09.6 - 04.5 %   MCV 89.0  78.0 - 100.0 fL   MCH 31.2  26.0 - 34.0 pg   MCHC 35.0  30.0 - 36.0 g/dL   RDW 40.9  81.1 - 91.4 %   Platelets 314  150 - 400 K/uL   Neutrophils Relative % 56  43 - 77 %   Neutro Abs 4.6  1.7 - 7.7 K/uL   Lymphocytes Relative 36  12 - 46 %   Lymphs Abs 2.9  0.7 - 4.0 K/uL   Monocytes Relative 6  3 - 12 %   Monocytes Absolute 0.5  0.1 - 1.0 K/uL   Eosinophils  Relative 2  0 - 5 %   Eosinophils Absolute 0.1  0.0 - 0.7 K/uL   Basophils Relative 0  0 - 1 %   Basophils Absolute 0.0  0.0 - 0.1 K/uL  BASIC METABOLIC PANEL      Result Value Range   Sodium 135  135 - 145 mEq/L  Potassium 3.4 (*) 3.5 - 5.1 mEq/L   Chloride 99  96 - 112 mEq/L   CO2 25  19 - 32 mEq/L   Glucose, Bld 106 (*) 70 - 99 mg/dL   BUN 8  6 - 23 mg/dL   Creatinine, Ser 9.60  0.50 - 1.35 mg/dL   Calcium 9.9  8.4 - 45.4 mg/dL   GFR calc non Af Amer >90  >90 mL/min   GFR calc Af Amer >90  >90 mL/min  POCT I-STAT TROPONIN I      Result Value Range   Troponin i, poc 0.00  0.00 - 0.08 ng/mL   Comment 3             Date: 05/28/2013  Rate: 68  Rhythm: normal sinus rhythm  QRS Axis: normal  Intervals: normal  ST/T Wave abnormalities: normal  Conduction Disutrbances:none  Narrative Interpretation:   Old EKG Reviewed: 05/22/13 - unchanged    MDM   1. Chest pain      Roy Wilson is a 27 y.o. male with a PMH of asthma and COPD who presents to the ED for evaluation of chest pain.  CBC, BMP, troponin, chest x-ray, and EKG ordered to further evaluate.     Rechecks  8:00 PM = patient states that he is developing a headache. Ibuprofen ordered.  Spoke with patient about results. Patient states that he continues to have chest pain with no changes. He states that he has been using his albuterol inhaler for his shortness of breath which seems to help him. He states he could not find his inhaler this morning. He states that he is almost out as an inhaler and needs a refill. 8:45 PM = patient asking for Flexeril. He states that it helps him with anxiety and sleep.  I did not refill his prescription as I do not think it is pertinent to today's visit. I told him to followup with a primary care provider if he is still having anxiety and difficulty sleeping. He states that Flexeril as a muscle relaxer and not typically used for sleep and anxiety. Patient upset that I would not give him a  prescription and left.     Etiology of chest pain unclear. Musculoskeletal pain is a possible cause.  Anxiety may be a contributing factor.  Patient states he has intermittently been having episodes of shortness of breath, however, denied this in the ED.  He has a history of asthma and chronic bronchitis. He is currently still smoking. He has been using his inhaler with improvement in his symptoms of his shortness of breath. He states that he was not able to use his inhaler today which may have exacerbated his symptoms.  His CBC and BMP were within normal limits. His troponin was negative and his chest x-ray was negative for an acute cardiopulmonary process.  EKG negative for any acute ischemic changes.  Patient has low risk factors for cardiac disease.  Well score 0.  PERC negative. Patient remained in no acute distress throughout his ED visit. He was instructed to followup with a primary care provider for further evaluation and management. Patient has had several ED visits for the same complaint however has not followed up with anyone do to financial reasons. He states he's going to get his Medicaid and will followup. Patient was given strict return precautions.  He was encouraged to quit smoking and provided smoking cessation instructions. Patient in agreement with discharge and plan.     Final  impressions: 1. Chest pain   Luiz Iron PA-C   This patient was discussed with Dr. Wenda Low, PA-C 05/28/13 1907

## 2013-05-27 NOTE — ED Notes (Signed)
He states he has had recurrent left-sided chest pains for which he has been seen at [facility] facilities x~ 2 months.  He states the frequency and severity of the pain is gradually increasing, and today he noted this discomfort to radiate down his left arm and he had some shortness of breath.  He is in no distress.  He tells me he underwent lap. Appendectomy in July of this year.

## 2013-05-27 NOTE — Discharge Instructions (Signed)
Follow-up with a PCP for further evaluation and management - you may need to see a cariologist Return to the ED if you have a fever, change/worsening chest pain, wheezing/difficulty breathing not relieved by your inhaler, coughing up blood, weakness, swelling in your legs/one leg, any other concerns (see below)  See below for smoking cessation help  Use inhaler 2 puffs every 4 hours as needed    Chest Pain (Nonspecific) It is often hard to give a specific diagnosis for the cause of chest pain. There is always a chance that your pain could be related to something serious, such as a heart attack or a blood clot in the lungs. You need to follow up with your caregiver for further evaluation. CAUSES   Heartburn.  Pneumonia or bronchitis.  Anxiety or stress.  Inflammation around your heart (pericarditis) or lung (pleuritis or pleurisy).  A blood clot in the lung.  A collapsed lung (pneumothorax). It can develop suddenly on its own (spontaneous pneumothorax) or from injury (trauma) to the chest.  Shingles infection (herpes zoster virus). The chest wall is composed of bones, muscles, and cartilage. Any of these can be the source of the pain.  The bones can be bruised by injury.  The muscles or cartilage can be strained by coughing or overwork.  The cartilage can be affected by inflammation and become sore (costochondritis). DIAGNOSIS  Lab tests or other studies, such as X-rays, electrocardiography, stress testing, or cardiac imaging, may be needed to find the cause of your pain.  TREATMENT   Treatment depends on what may be causing your chest pain. Treatment may include:  Acid blockers for heartburn.  Anti-inflammatory medicine.  Pain medicine for inflammatory conditions.  Antibiotics if an infection is present.  You may be advised to change lifestyle habits. This includes stopping smoking and avoiding alcohol, caffeine, and chocolate.  You may be advised to keep your head  raised (elevated) when sleeping. This reduces the chance of acid going backward from your stomach into your esophagus.  Most of the time, nonspecific chest pain will improve within 2 to 3 days with rest and mild pain medicine. HOME CARE INSTRUCTIONS   If antibiotics were prescribed, take your antibiotics as directed. Finish them even if you start to feel better.  For the next few days, avoid physical activities that bring on chest pain. Continue physical activities as directed.  Do not smoke.  Avoid drinking alcohol.  Only take over-the-counter or prescription medicine for pain, discomfort, or fever as directed by your caregiver.  Follow your caregiver's suggestions for further testing if your chest pain does not go away.  Keep any follow-up appointments you made. If you do not go to an appointment, you could develop lasting (chronic) problems with pain. If there is any problem keeping an appointment, you must call to reschedule. SEEK MEDICAL CARE IF:   You think you are having problems from the medicine you are taking. Read your medicine instructions carefully.  Your chest pain does not go away, even after treatment.  You develop a rash with blisters on your chest. SEEK IMMEDIATE MEDICAL CARE IF:   You have increased chest pain or pain that spreads to your arm, neck, jaw, back, or abdomen.  You develop shortness of breath, an increasing cough, or you are coughing up blood.  You have severe back or abdominal pain, feel nauseous, or vomit.  You develop severe weakness, fainting, or chills.  You have a fever. THIS IS AN EMERGENCY. Do not wait  to see if the pain will go away. Get medical help at once. Call your local emergency services (911 in [place] Do not drive yourself to the hospital. MAKE SURE YOU:   Understand these instructions.  Will watch your condition.  Will get help right away if you are not doing well or get worse. Document Released: 04/22/2005 Document Revised:  10/05/2011 Document Reviewed: 02/16/2008 [facility] Patient Information 2014 [place], [place].   Shortness of Breath Shortness of breath means you have trouble breathing. Shortness of breath may indicate that you have a medical problem. You should seek immediate medical care for shortness of breath. CAUSES   Not enough oxygen in the air (as with high altitudes or a smoke-filled room).  Short-term (acute) lung disease, including:  Infections, such as pneumonia.  Fluid in the lungs, such as heart failure.  A blood clot in the lungs (pulmonary embolism).  Long-term (chronic) lung diseases.  Heart disease (heart attack, angina, heart failure, and others).  Low red blood cells (anemia).  Poor physical fitness. This can cause shortness of breath when you exercise.  Chest or back injuries or stiffness.  Being overweight.  Smoking.  Anxiety. This can make you feel like you are not getting enough air. DIAGNOSIS  Serious medical problems can usually be found during your physical exam. Tests may also be done to determine why you are having shortness of breath. Tests may include:  Chest X-rays.  Lung function tests.  Blood tests.  Electrocardiography.  Exercise testing.  Echocardiography.  Imaging scans. Your caregiver may not be able to find a cause for your shortness of breath after your exam. In this case, it is important to have a follow-up exam with your caregiver as directed.  TREATMENT  Treatment for shortness of breath depends on the cause of your symptoms and can vary greatly. HOME CARE INSTRUCTIONS   Do not smoke. Smoking is a common cause of shortness of breath. If you smoke, ask for help to quit.  Avoid being around chemicals or things that may bother your breathing, such as paint fumes and dust.  Rest as needed. Slowly resume your usual activities.  If medicines were prescribed, take them as directed for the full length of time directed. This includes oxygen  and any inhaled medicines.  Keep all follow-up appointments as directed by your caregiver. SEEK MEDICAL CARE IF:   Your condition does not improve in the time expected.  You have a hard time doing your normal activities even with rest.  You have any side effects or problems with the medicines prescribed.  You develop any new symptoms. SEEK IMMEDIATE MEDICAL CARE IF:   Your shortness of breath gets worse.  You feel lightheaded, faint, or develop a cough not controlled with medicines.  You start coughing up blood.  You have pain with breathing.  You have chest pain or pain in your arms, shoulders, or abdomen.  You have a fever.  You are unable to walk up stairs or exercise the way you normally do. MAKE SURE YOU:  Understand these instructions.  Will watch your condition.  Will get help right away if you are not doing well or get worse. Document Released: 04/07/2001 Document Revised: 01/12/2012 Document Reviewed: 09/28/2011 [facility] Patient Information 2014 [place], [place].  Cough, Adult  A cough is a reflex that helps clear your throat and airways. It can help heal the body or may be a reaction to an irritated airway. A cough may only last 2 or 3 weeks (  acute) or may last more than 8 weeks (chronic).  CAUSES Acute cough:  Viral or bacterial infections. Chronic cough:  Infections.  Allergies.  Asthma.  Post-nasal drip.  Smoking.  Heartburn or acid reflux.  Some medicines.  Chronic lung problems (COPD).  Cancer. SYMPTOMS   Cough.  Fever.  Chest pain.  Increased breathing rate.  High-pitched whistling sound when breathing (wheezing).  Colored mucus that you cough up (sputum). TREATMENT   A bacterial cough may be treated with antibiotic medicine.  A viral cough must run its course and will not respond to antibiotics.  Your caregiver may recommend other treatments if you have a chronic cough. HOME CARE INSTRUCTIONS   Only take  over-the-counter or prescription medicines for pain, discomfort, or fever as directed by your caregiver. Use cough suppressants only as directed by your caregiver.  Use a cold steam vaporizer or humidifier in your bedroom or home to help loosen secretions.  Sleep in a semi-upright position if your cough is worse at night.  Rest as needed.  Stop smoking if you smoke. SEEK IMMEDIATE MEDICAL CARE IF:   You have pus in your sputum.  Your cough starts to worsen.  You cannot control your cough with suppressants and are losing sleep.  You begin coughing up blood.  You have difficulty breathing.  You develop pain which is getting worse or is uncontrolled with medicine.  You have a fever. MAKE SURE YOU:   Understand these instructions.  Will watch your condition.  Will get help right away if you are not doing well or get worse. Document Released: 01/09/2011 Document Revised: 10/05/2011 Document Reviewed: 01/09/2011 [facility] Patient Information 2014 [place], [place].  Smoking Cessation Quitting smoking is important to your health and has many advantages. However, it is not always easy to quit since nicotine is a very addictive drug. Often times, people try 3 times or more before being able to quit. This document explains the best ways for you to prepare to quit smoking. Quitting takes hard work and a lot of effort, but you can do it. ADVANTAGES OF QUITTING SMOKING  You will live longer, feel better, and live better.  Your body will feel the impact of quitting smoking almost immediately.  Within 20 minutes, blood pressure decreases. Your pulse returns to its normal level.  After 8 hours, carbon monoxide levels in the blood return to normal. Your oxygen level increases.  After 24 hours, the chance of having a heart attack starts to decrease. Your breath, hair, and body stop smelling like smoke.  After 48 hours, damaged nerve endings begin to recover. Your sense of taste and smell  improve.  After 72 hours, the body is virtually free of nicotine. Your bronchial tubes relax and breathing becomes easier.  After 2 to 12 weeks, lungs can hold more air. Exercise becomes easier and circulation improves.  The risk of having a heart attack, stroke, cancer, or lung disease is greatly reduced.  After 1 year, the risk of coronary heart disease is cut in half.  After 5 years, the risk of stroke falls to the same as a nonsmoker.  After 10 years, the risk of lung cancer is cut in half and the risk of other cancers decreases significantly.  After 15 years, the risk of coronary heart disease drops, usually to the level of a nonsmoker.  If you are pregnant, quitting smoking will improve your chances of having a healthy baby.  The people you live with, especially any children, will  be healthier.  You will have extra money to spend on things other than cigarettes. QUESTIONS TO THINK ABOUT BEFORE ATTEMPTING TO QUIT You may want to talk about your answers with your caregiver.  Why do you want to quit?  If you tried to quit in the past, what helped and what did not?  What will be the most difficult situations for you after you quit? How will you plan to handle them?  Who can help you through the tough times? Your family? Friends? A caregiver?  What pleasures do you get from smoking? What ways can you still get pleasure if you quit? Here are some questions to ask your caregiver:  How can you help me to be successful at quitting?  What medicine do you think would be best for me and how should I take it?  What should I do if I need more help?  What is smoking withdrawal like? How can I get information on withdrawal? GET READY  Set a quit date.  Change your environment by getting rid of all cigarettes, ashtrays, matches, and lighters in your home, car, or work. Do not let people smoke in your home.  Review your past attempts to quit. Think about what worked and what did  not. GET SUPPORT AND ENCOURAGEMENT You have a better chance of being successful if you have help. You can get support in many ways.  Tell your family, friends, and co-workers that you are going to quit and need their support. Ask them not to smoke around you.  Get individual, group, or telephone counseling and support. Programs are available at [employer] and health centers. Call your local health department for information about programs in your area.  Spiritual beliefs and practices may help some smokers quit.  Download a "quit meter" on your computer to keep track of quit statistics, such as how long you have gone without smoking, cigarettes not smoked, and money saved.  Get a self-help book about quitting smoking and staying off of tobacco. LEARN NEW SKILLS AND BEHAVIORS  Distract yourself from urges to smoke. Talk to someone, go for a walk, or occupy your time with a task.  Change your normal routine. Take a different route to work. Drink tea instead of coffee. Eat breakfast in a different place.  Reduce your stress. Take a hot bath, exercise, or read a book.  Plan something enjoyable to do every day. Reward yourself for not smoking.  Explore interactive web-based programs that specialize in helping you quit. GET MEDICINE AND USE IT CORRECTLY Medicines can help you stop smoking and decrease the urge to smoke. Combining medicine with the above behavioral methods and support can greatly increase your chances of successfully quitting smoking.  Nicotine replacement therapy helps deliver nicotine to your body without the negative effects and risks of smoking. Nicotine replacement therapy includes nicotine gum, lozenges, inhalers, nasal sprays, and skin patches. Some may be available over-the-counter and others require a prescription.  Antidepressant medicine helps people abstain from smoking, but how this works is unknown. This medicine is available by prescription.  Nicotinic  receptor partial agonist medicine simulates the effect of nicotine in your brain. This medicine is available by prescription. Ask your caregiver for advice about which medicines to use and how to use them based on your health history. Your caregiver will tell you what side effects to look out for if you choose to be on a medicine or therapy. Carefully read the information on the package. Do  not use any other product containing nicotine while using a nicotine replacement product.  RELAPSE OR DIFFICULT SITUATIONS Most relapses occur within the first 3 months after quitting. Do not be discouraged if you start smoking again. Remember, most people try several times before finally quitting. You may have symptoms of withdrawal because your body is used to nicotine. You may crave cigarettes, be irritable, feel very hungry, cough often, get headaches, or have difficulty concentrating. The withdrawal symptoms are only temporary. They are strongest when you first quit, but they will go away within 10 14 days. To reduce the chances of relapse, try to:  Avoid drinking alcohol. Drinking lowers your chances of successfully quitting.  Reduce the amount of caffeine you consume. Once you quit smoking, the amount of caffeine in your body increases and can give you symptoms, such as a rapid heartbeat, sweating, and anxiety.  Avoid smokers because they can make you want to smoke.  Do not let weight gain distract you. Many smokers will gain weight when they quit, usually less than 10 pounds. Eat a healthy diet and stay active. You can always lose the weight gained after you quit.  Find ways to improve your mood other than smoking. FOR MORE INFORMATION  www.smokefree.gov  Document Released: 07/07/2001 Document Revised: 01/12/2012 Document Reviewed: 10/22/2011 [facility] Patient Information 2014 [place], [place].  RESOURCE GUIDE  Chronic Pain Problems: Contact [facility]  [phone] Patients need to  be referred by their primary care doctor.  Insufficient Money for Medicine: Contact [employer]:  call [phone]  No Primary Care Doctor: - Call Health Connect  [phone] - can help you locate a primary care doctor that  accepts your insurance, provides certain services, etc. - Physician Referral Service- [phone]  Agencies that provide inexpensive medical care: - [facility]  [phone] - [facility]  [phone] - [facility]  [phone] - Women's Clinic  [phone] - Planned Parenthood  [phone] [facility]  [phone]  Medicaid-accepting [facility] Providers: - [facility]- [address]  [phone], Mon-Fri 9am-7pm, Sat 9am-1pm - [facility]- [address] [place], [address] [place]  [phone] - [facility]- [address] [place]  [phone] [facility]- [address]  [phone] - Renaye Rakers- [address] [place], [address], [phone]  Only accepts [place] patients after they have their name  applied to their card  Self Pay (no insurance) in [place]: - Sickle Cell Patients - [facility]  [address] [place], [phone] - [facility]- [address] [place]  [phone]       [facility] [place]- [address]       -     [facility]- see information above (Speak to [employer] if you do not have insurance)       -  [facility]- [address],  [phone]       -  [facility]- [address] [phone]       -  Dr Julio Sicks-  [address], [place], [phone]       -  [facility] - [address], [phone]       -  [facility]- [address] [phone],  also [address], [phone]       -     [facility]- [address] [place], [phone], 1st  & 3rd Saturday         every month, 10am-1pm  -     [facility] [employer]   [address], [place].   Phone:  [phone], Fax:  [phone]. Hours of Operation:  9 am - 6 pm, M-F.  -     [facility]   [address], [place]   Phone: [phone], Fax: [phone]. Hours of Operation:  8:30 am - 5:30 pm, M-F.  [facility] [address] [place], [place] [address] [phone]  [facility] [address] [place], [place] [address] [phone]  1) Find a Doctor and Pay Out of Pocket Although you won't have to find out who is covered by your insurance plan, it is a good idea to ask around and get recommendations. You will then need to call the office and see if the doctor you have chosen will accept you as a new patient and what types of options they offer for patients who are self-pay. Some doctors offer discounts or will set up payment plans for their patients who do not have insurance, but you will need to ask so you aren't surprised when you get to your appointment.  2) Contact Your Local Health Department Not all health departments have doctors that can see patients for sick visits, but many do, so it is worth a call to see if yours does. If you don't know where your local health department is, you can check in your phone book. The CDC also has a tool to help you locate your state's health department, and many state websites also have listings of all of their local health departments.  3) Find a Walk-in Clinic If your illness is not likely to be very severe or complicated, you may want to try a walk in clinic. These are popping up all over the country in pharmacies, drugstores, and shopping centers. They're usually staffed by nurse practitioners or physician assistants that have been trained to treat common illnesses and complaints. They're usually fairly quick and inexpensive. However, if you have  serious medical issues or chronic medical problems, these are probably not your best option  STD Testing - [facility] [place], STD Clinic, [address] [place], phone [phone] or [phone].  Monday - Friday, call for an appointment. [facility] of [employer], STD Clinic, [address], [place], phone [phone] or [phone].  Monday - Friday, call for an appointment.  Abuse/Neglect: [facility] Child Abuse Hotline 681-179-8706 [facility] Child Abuse Hotline [phone] (After Hours)  Emergency Shelter:  [facility] [phone]  Maternity Homes: - Room at the [place] [facility] [phone] - [facility] [phone]  MRSA Hotline #:   610-399-3352  Dental Assistance If unable to pay or uninsured, contact:  [facility]. to become qualified for the adult dental clinic.  Patients with Medicaid: [facility] [phone] [address], [phone] [address] [phone]  If unable to pay, or uninsured, contact [facility] [phone] in [place], [phone] in [facility]) to become qualified for the adult dental clinic  [facility] [address] [place],  [place] [address] [phone] [email or web address]  Other Proofreader Services: - [facility]- [address] [place], [place], [place], [address], [phone], Ext. [phone], 2nd and 4th Thursday of the month at 6:30am.  10 clients each day by appointment, can sometimes see walk-in patients if someone does not show for an appointment. [facility]- [address] Ether Griffins [place], [place], [address], [phone] - [facility] [address] [place], [place], [address], [phone] - [facility]- [phone] [facility] Health Department- [phone] [facility] Health  [phone]       Behavioral Health Resources in the [facility]  Intensive Outpatient Programs: [facility]      [address] [place], [place] [phone] Both a day and evening program       [facility] Outpatient     [address]        [place], [place] [address] [phone]         ADS: [facility] [address] [place] [phone]  [facility] Mental Health ACCESS LINE: [phone] or [phone] [address] [place], [place] [address] [email or web address]   Substance Abuse Resources: - [facility] Drug [facility]  [phone] - [facility] [phone] - [facility] [phone] Floydene Flock [phone] - Residential & Outpatient Substance Abuse Program  [phone]  Psychological Services: [facility]  [phone] [facility]  [phone] - [facility], [phone] [place]. [address] [place], ACCESS LINE: ([phone] or [phone], [email or web address]  Mobile Crisis Teams:                                        [facility]         Mobile Crisis Care Unit [phone]             [facility] [address] Ginette Otto [phone]                                         Interventionist [address] [place] [address] [place] [phone]  Self-Help/Support Groups: [facility] of [employer] of support groups 940-057-3348 (call for more info)  Narcotics Anonymous (NA) Caring [facility] [address] [place] - 2 meetings at this location  Residential Treatment Programs:  [facility]      [address]        [place]       [phone]         [facility] [address] [place] [address] [place], [place]  [address] [phone]  [facility]  Treatment Facility  [address] [place], [place] [address] [phone] Admissions: 8am-3pm M-F  [facility]     [address]        [place], [place] [address]       [phone]         [facility] [address] [place], [place] [phone]  [facility] [address] [place], [place] [phone]  [facility] Intensive Outpatient      [address]     [place], [place]       [phone]  [facility] ([facility]     [address] [place], [place] [phone] or [phone]  [facility] ([facility]), Medicaid [address] [place], [place] [phone]  [facility] [address]                                               [address] [place] [phone]  [facility] Resources: [phone]               General Therapy                                                Angie Fava, PhD        [address] [place], [place] [address]         [phone]   Insurance  [facility]   [address] [place], [place] [address] [phone]  [facility] [address] [place], [place] [address] [phone] Insurance/Medicaid/sponsorship through [facility]                                              [address]                                        [place], [place] [address]    Therapy/tele-psych/case         [phone]          [facility] [address], [place]  [address]  Adolescent/group home/case management [phone]                                           Creola Corn PhD       General therapy       Insurance   [phone]         Dr. Lolly Mustache, [place], M-F [phone]  [facility]  [employer] [facility] Dept. [address]                 [address]         [address] [place] [address]  [place]                                               Cristobal Goldmann Phone:  [phone]  Phone:  [phone]                   Phone:  [phone]  [facility], [phone] - [facility] - [phone]       -     [facility] in [place], [address]             [phone], Insurance  [place] [facility] Abuse Hotline [phone] or [phone] (After Hours)

## 2013-05-30 ENCOUNTER — Encounter (HOSPITAL_COMMUNITY): Payer: Self-pay | Admitting: Emergency Medicine

## 2013-05-30 ENCOUNTER — Emergency Department (HOSPITAL_COMMUNITY)
Admission: EM | Admit: 2013-05-30 | Discharge: 2013-05-30 | Disposition: A | Discharge status: Home / Self Care | Payer: BC Managed Care – PPO | Attending: Emergency Medicine | Admitting: Emergency Medicine

## 2013-05-30 DIAGNOSIS — R05 Cough: Secondary | ICD-10-CM | POA: Insufficient documentation

## 2013-05-30 DIAGNOSIS — R509 Fever, unspecified: Secondary | ICD-10-CM | POA: Insufficient documentation

## 2013-05-30 DIAGNOSIS — R51 Headache: Secondary | ICD-10-CM | POA: Insufficient documentation

## 2013-05-30 DIAGNOSIS — R630 Anorexia: Secondary | ICD-10-CM | POA: Insufficient documentation

## 2013-05-30 DIAGNOSIS — F17200 Nicotine dependence, unspecified, uncomplicated: Secondary | ICD-10-CM | POA: Insufficient documentation

## 2013-05-30 DIAGNOSIS — J449 Chronic obstructive pulmonary disease, unspecified: Secondary | ICD-10-CM | POA: Insufficient documentation

## 2013-05-30 DIAGNOSIS — R059 Cough, unspecified: Secondary | ICD-10-CM | POA: Insufficient documentation

## 2013-05-30 DIAGNOSIS — B349 Viral infection, unspecified: Secondary | ICD-10-CM

## 2013-05-30 DIAGNOSIS — R5381 Other malaise: Secondary | ICD-10-CM | POA: Insufficient documentation

## 2013-05-30 DIAGNOSIS — J4489 Other specified chronic obstructive pulmonary disease: Secondary | ICD-10-CM | POA: Insufficient documentation

## 2013-05-30 DIAGNOSIS — Z79899 Other long term (current) drug therapy: Secondary | ICD-10-CM | POA: Insufficient documentation

## 2013-05-30 DIAGNOSIS — B9789 Other viral agents as the cause of diseases classified elsewhere: Secondary | ICD-10-CM | POA: Insufficient documentation

## 2013-05-30 DIAGNOSIS — R599 Enlarged lymph nodes, unspecified: Secondary | ICD-10-CM | POA: Insufficient documentation

## 2013-05-30 NOTE — ED Notes (Signed)
Pt states earlier today pt states he went [place] the post office and when he got back his whole body started hurting and he became fatigue  Pt states he has general body aches and pressure across his forehead  Pt states he has had chills and had a fever earlier

## 2013-05-30 NOTE — ED Provider Notes (Signed)
CSN: [id number]     Arrival date & time 05/30/13  2136 History  This chart was scribed for Antony Madura, PA, working with Lyanne Co, MD, by [facility] ED Scribe. This patient was seen in room WTR5/WTR5 and the patient's care was started at 10:25 PM.    Chief Complaint  Patient presents with  . Fever  . Headache  . Generalized Body Aches  . Fatigue    Patient is a 27 y.o. male presenting with fever. The history is provided by the patient. No language interpreter was used.  Fever Max temp prior to arrival:  100.3 Temp source:  Subjective and oral Onset quality:  Gradual Timing:  Intermittent Progression:  Improving Chronicity:  New Relieved by:  None tried Worsened by:  Nothing tried Ineffective treatments:  None tried Associated symptoms: chills, cough, headaches and myalgias   Associated symptoms: no chest pain, no congestion, no diarrhea ("soft stools", but no diarrhea, per pt), no ear pain, no nausea, no rhinorrhea, no sore throat and no vomiting     HPI Comments: Roy Wilson is a 27 y.o. male who presents to the Emergency Department complaining of generalized myalgias onset suddenly earlier today. He also reports associated frontal headache described as "pressure", chills and fatigue onset today. He also states that he has had a reduced appetite today. He states that he took his temperature at 100.3 F today. He also states that he has had soft stool today. He states that he has had a productive cough of dark yellow sputum for "a while". He states that he has taken Benadryl earlier today for symptoms, which he states offered no relief and made him feel worse. He states that he has had recent sick contacts with others with a head cold. He denies nasal congestion, rhinorrhea, trouble swallowing, drooling, nausea, emesis, chest pain or any other symptoms. He is a current every day smoker.   Past Medical History  Diagnosis Date  . Asthma   . COPD (chronic obstructive  pulmonary disease)    Past Surgical History  Procedure Laterality Date  . Femur fracture surgery  as child  . Laparoscopic appendectomy N/A 01/29/2013    Procedure: APPENDECTOMY LAPAROSCOPIC;  Surgeon: Cherylynn Ridges, MD;  Location: [facility] OR;  Service: General;  Laterality: N/A;  . Appendectomy    . Tonsillectomy     Family History  Problem Relation Age of Onset  . Hypertension Mother   . Diabetes Mother   . Hypertension Other    History  Substance Use Topics  . Smoking status: Current Every Day Smoker -- 0.50 packs/day for 7 years    Types: Cigarettes  . Smokeless tobacco: Former Neurosurgeon    Quit date: 11/06/2010  . Alcohol Use: Yes     Comment: occ    Review of Systems  Constitutional: Positive for fever, chills, appetite change (reduced) and fatigue.  HENT: Negative for congestion, drooling, ear pain, rhinorrhea, sore throat and trouble swallowing.   Respiratory: Positive for cough.   Cardiovascular: Negative for chest pain.  Gastrointestinal: Negative for nausea, vomiting and diarrhea ("soft stools", but no diarrhea, per pt).  Musculoskeletal: Positive for myalgias.  Neurological: Positive for headaches.  All other systems reviewed and are negative.    Allergies  Codeine  Home Medications   Current Outpatient Rx  Name  Route  Sig  Dispense  Refill  . albuterol (PROVENTIL HFA;VENTOLIN HFA) 108 (90 BASE) MCG/ACT inhaler   Inhalation   Inhale 2 puffs into the  lungs every 4 (four) hours as needed for wheezing or shortness of breath.   1 Inhaler   0   . methocarbamol (ROBAXIN) 500 MG tablet   Oral   Take 2 tablets (1,000 mg total) by mouth 4 (four) times daily.   20 tablet   0   . pantoprazole (PROTONIX) 40 MG tablet   Oral   Take 40 mg by mouth daily as needed (heart burn).          . traMADol (ULTRAM) 50 MG tablet   Oral   Take 1 tablet (50 mg total) by mouth every 6 (six) hours as needed for pain.   10 tablet   0    Triage Vitals: BP 121/73  Pulse 88   Temp(Src) 98.6 F (37 C) (Oral)  Resp 18  SpO2 100%  Physical Exam  Nursing note and vitals reviewed. Constitutional: He is oriented to person, place, and time. He appears well-developed and well-nourished. No distress.  HENT:  Head: Normocephalic and atraumatic.  Tenderness to bilateral frontal and right maxillary sinuses. Uvula midline. Airway nonerythematous and patent. Patient tolerating secretions without difficulty.  Eyes: Conjunctivae and EOM are normal. No scleral icterus.  Neck: Normal range of motion.  Swelling and tenderness to right anterior cervical lymph nodes.  Cardiovascular: Normal rate, regular rhythm and normal heart sounds.   Pulmonary/Chest: Effort normal and breath sounds normal. No respiratory distress. He has no wheezes. He has no rales.  Lungs clear to auscultation bilaterally.  Musculoskeletal: Normal range of motion.  Lymphadenopathy:    He has cervical adenopathy.  Neurological: He is alert and oriented to person, place, and time.  Skin: Skin is warm and dry. No rash noted. He is not diaphoretic. No erythema. No pallor.  Psychiatric: He has a normal mood and affect. His behavior is normal.    ED Course  Procedures (including critical care time)  DIAGNOSTIC STUDIES: Oxygen Saturation is 100% on RA, normal by my interpretation.    COORDINATION OF CARE: 10:30 PM- Discussed clinical suspicion that pt has a virus. Discussed at home symptomatic care. Pt states that he does not need a note for work. Pt advised of plan for treatment and pt agrees.  Labs Review Labs Reviewed - No data to display Imaging Review No results found.  EKG Interpretation   None       MDM   1. Viral illness   2. Sinus headache    Patient present for frontal headache with associated subjective fever, myalgias, anorexia, and cough. Patient well and nontoxic appearing, hemodynamically stable, and afebrile today. Symptoms and physical exam findings c/w viral illness. Suspect  headache to be 2/2 sinus congestion. Patient without tachycardia, tachypnea, dyspnea, and hypoxia. Doubt pneumonia and do not believe further work up with imaging is indicated at this time. Patient appropriate for d/c with instruction for PCP follow up. Return precautions discussed and patient agreeable to plan with no unaddressed concerns.  I personally performed the services described in this documentation, which was scribed in my presence. The recorded information has been reviewed and is accurate.       Antony Madura, PA-C 06/03/13 [phone]

## 2013-06-01 NOTE — ED Provider Notes (Signed)
Medical screening examination/treatment/procedure(s) were performed by non-physician practitioner and as supervising physician I was immediately available for consultation/collaboration.  EKG Interpretation     Ventricular Rate:  68 PR Interval:  130 QRS Duration: 107 QT Interval:  387 QTC Calculation: 411 R Axis:   57 Text Interpretation:  Sinus rhythm RSR' in V1 or V2, right VCD or RVH ED PHYSICIAN INTERPRETATION AVAILABLE IN [place] Tacey Ruiz, MD 06/01/13 [phone]

## 2013-06-03 NOTE — ED Provider Notes (Signed)
Medical screening examination/treatment/procedure(s) were performed by non-physician practitioner and as supervising physician I was immediately available for consultation/collaboration.  EKG Interpretation   None         Tobin Witucki M Brigham Cobbins, MD 06/03/13 1559 

## 2013-07-10 ENCOUNTER — Emergency Department (HOSPITAL_COMMUNITY): Payer: Self-pay

## 2013-07-10 ENCOUNTER — Encounter (HOSPITAL_COMMUNITY): Payer: Self-pay | Admitting: Emergency Medicine

## 2013-07-10 ENCOUNTER — Emergency Department (HOSPITAL_COMMUNITY)
Admission: EM | Admit: 2013-07-10 | Discharge: 2013-07-10 | Disposition: A | Discharge status: Home / Self Care | Payer: Self-pay | Attending: Emergency Medicine | Admitting: Emergency Medicine

## 2013-07-10 DIAGNOSIS — J441 Chronic obstructive pulmonary disease with (acute) exacerbation: Secondary | ICD-10-CM | POA: Insufficient documentation

## 2013-07-10 DIAGNOSIS — J40 Bronchitis, not specified as acute or chronic: Secondary | ICD-10-CM

## 2013-07-10 DIAGNOSIS — F17200 Nicotine dependence, unspecified, uncomplicated: Secondary | ICD-10-CM | POA: Insufficient documentation

## 2013-07-10 DIAGNOSIS — Z79899 Other long term (current) drug therapy: Secondary | ICD-10-CM | POA: Insufficient documentation

## 2013-07-10 DIAGNOSIS — R042 Hemoptysis: Secondary | ICD-10-CM | POA: Insufficient documentation

## 2013-07-10 DIAGNOSIS — J209 Acute bronchitis, unspecified: Secondary | ICD-10-CM | POA: Insufficient documentation

## 2013-07-10 MED ORDER — ALBUTEROL SULFATE HFA 108 (90 BASE) MCG/ACT IN AERS: 2.0000 | INHALATION_SPRAY | RESPIRATORY_TRACT | Status: DC | PRN

## 2013-07-10 MED ORDER — IPRATROPIUM BROMIDE 0.02 % IN SOLN
0.5000 mg | Freq: Once | RESPIRATORY_TRACT | Status: AC
  Administered 2013-07-10: 0.5 mg via RESPIRATORY_TRACT
  Filled 2013-07-10: qty 2.5

## 2013-07-10 MED ORDER — AMOXICILLIN 500 MG PO CAPS: 500.0000 mg | ORAL_CAPSULE | Freq: Three times a day (TID) | ORAL | Status: DC

## 2013-07-10 MED ORDER — ALBUTEROL SULFATE (5 MG/ML) 0.5% IN NEBU
5.0000 mg | INHALATION_SOLUTION | Freq: Once | RESPIRATORY_TRACT | Status: AC
  Administered 2013-07-10: 5 mg via RESPIRATORY_TRACT
  Filled 2013-07-10: qty 1

## 2013-07-10 NOTE — ED Notes (Signed)
Patient complains of burning in his throat states that he takes protonix daily. Also states that sometimes it's hard to catch his breath. 02 sat 98%

## 2013-07-10 NOTE — ED Provider Notes (Signed)
CSN: [id number]     Arrival date & time 07/10/13  1237 History   First MD Initiated Contact with Patient 07/10/13 1300     Chief Complaint  Patient presents with  . Cough   (Consider location/radiation/quality/duration/timing/severity/associated sxs/prior Treatment) HPI Patient reports he's been getting short of breath for the past 2 and half months. He states he has a chronic cough and yesterday he coughed up some blood and he did have some blood-tinged sputum today. He states he's had that  in the past. He denies any fever. He states he did have some left-sided chest pain that lasted a few minutes but it goes away when he relaxes. He does not associate it with coughing. He states sometimes his left arm feels numb in his left hand feels cold. He states he's been wheezing but ran out of his inhaler. He states he didn't have money to buy another inhaler. He denies any sore throat or rhinorrhea but does complain of a burning in his upper chest. He states he has been taking his proton is without relief.  PCP none  Past Medical History  Diagnosis Date  . Asthma   . COPD (chronic obstructive pulmonary disease)    Past Surgical History  Procedure Laterality Date  . Femur fracture surgery  as child  . Laparoscopic appendectomy N/A 01/29/2013    Procedure: APPENDECTOMY LAPAROSCOPIC;  Surgeon: Cherylynn Ridges, MD;  Location: [facility] OR;  Service: General;  Laterality: N/A;  . Appendectomy    . Tonsillectomy     Family History  Problem Relation Age of Onset  . Hypertension Mother   . Diabetes Mother   . Hypertension Other    History  Substance Use Topics  . Smoking status: Current Every Day Smoker -- 0.50 packs/day for 7 years    Types: Cigarettes  . Smokeless tobacco: Former Neurosurgeon    Quit date: 11/06/2010  . Alcohol Use: Yes     Comment: occ  unemployed Switched to e cigarette 1 weeks ago, was smoking 1/2 ppd Drank four 16 ounces of beer last night  Review of Systems  All other systems  reviewed and are negative.    Allergies  Codeine  Home Medications   Current Outpatient Rx  Name  Route  Sig  Dispense  Refill  . ibuprofen (ADVIL,MOTRIN) 200 MG tablet   Oral   Take 200 mg by mouth every 6 (six) hours as needed.         Marland Kitchen albuterol (PROVENTIL HFA;VENTOLIN HFA) 108 (90 BASE) MCG/ACT inhaler   Inhalation   Inhale 2 puffs into the lungs every 4 (four) hours as needed for wheezing or shortness of breath.   1 Inhaler  Ran out  0    BP 117/95  Pulse 85  Temp(Src) 97.6 F (36.4 C) (Oral)  Resp 20  SpO2 97%  Vital signs normal   Physical Exam  Nursing note and vitals reviewed. Constitutional: He is oriented to person, place, and time. He appears well-developed and well-nourished.  Non-toxic appearance. He does not appear ill. No distress.  HENT:  Head: Normocephalic and atraumatic.  Right Ear: External ear normal.  Left Ear: External ear normal.  Nose: Nose normal. No mucosal edema or rhinorrhea.  Mouth/Throat: Oropharynx is clear and moist and mucous membranes are normal. No dental abscesses or uvula swelling.  Eyes: Conjunctivae and EOM are normal. Pupils are equal, round, and reactive to light.  Neck: Normal range of motion and full passive range of motion without  pain. Neck supple.  Cardiovascular: Normal rate, regular rhythm and normal heart sounds.  Exam reveals no gallop and no friction rub.   No murmur heard. Pulmonary/Chest: Effort normal and breath sounds normal. No respiratory distress. He has no wheezes. He has no rhonchi. He has no rales. He exhibits no tenderness and no crepitus.  Abdominal: Soft. Normal appearance and bowel sounds are normal. He exhibits no distension. There is no tenderness. There is no rebound and no guarding.  Musculoskeletal: Normal range of motion. He exhibits no edema and no tenderness.  Moves all extremities well.   Neurological: He is alert and oriented to person, place, and time. He has normal strength. No cranial  nerve deficit.  Skin: Skin is warm, dry and intact. No rash noted. No erythema. No pallor.  Psychiatric: He has a normal mood and affect. His speech is normal and behavior is normal. His mood appears not anxious.    ED Course  Procedures (including critical care time)  Medications  albuterol (PROVENTIL) (5 MG/ML) 0.5% nebulizer solution 5 mg (5 mg Nebulization Given 07/10/13 1402)  ipratropium (ATROVENT) nebulizer solution 0.5 mg (0.5 mg Nebulization Given 07/10/13 1401)   Pt states he thinks he needs a breathing treatment.   Recheck, pt playing on his cell phone in NAD. Lungs are clear.  Case manager states he has already gotten his free inhaler and he won't be eligible for another until next fiscal year. He has also not followed up to get a PCP as well. They report they have already informed pt of this, when I also mentioned it he seemed surprised.    Labs Review Labs Reviewed - No data to display Imaging Review Dg Chest 2 View  07/10/2013   CLINICAL DATA:  Productive cough with blood.  Shortness of breath.  EXAM: CHEST  2 VIEW  COMPARISON:  05/27/2013.  FINDINGS: Trachea is midline. Heart size normal. Lungs are clear but hyperinflated. No pleural fluid.  IMPRESSION: Hyperinflation without acute finding.   Electronically Signed   By: Leanna Battles M.D.   On: 07/10/2013 13:49    EKG Interpretation    Date/Time:  Monday July 10 2013 12:49:03 EST Ventricular Rate:  73 PR Interval:  132 QRS Duration: 102 QT Interval:  382 QTC Calculation: 421 R Axis:   75 Text Interpretation:  Sinus rhythm RSR' in V1 or V2, right VCD or RVH No significant change since last tracing Confirmed by Corneshia Hines  MD-I, Amoura Ransier ([id number]) on 07/10/2013 3:11:42 PM            MDM   1. Bronchitis   2. Hemoptysis     Discharge Medication List as of 07/10/2013  3:27 PM    START taking these medications   Details  !! albuterol (PROVENTIL HFA;VENTOLIN HFA) 108 (90 BASE) MCG/ACT inhaler Inhale 2 puffs  into the lungs every 4 (four) hours as needed for wheezing or shortness of breath., Starting 07/10/2013, Until Discontinued, Print    amoxicillin (AMOXIL) 500 MG capsule Take 1 capsule (500 mg total) by mouth 3 (three) times daily., Starting 07/10/2013, Until Discontinued, Print     !! - Potential duplicate medications found. Please discuss with provider.     Plan discharge  Devoria Albe, MD, Franz Dell, MD 07/10/13 1556

## 2013-07-10 NOTE — ED Notes (Signed)
Pt c/o cough; productive with blood; no fever; abd pain; chest pain for "long time"

## 2013-07-10 NOTE — Progress Notes (Signed)
   CARE MANAGEMENT ED NOTE 07/10/2013  Patient:  Roy Wilson, Roy Wilson   Account Number:  [id number]  Date Initiated:  07/10/2013  Documentation initiated by:  Edd Arbour  Subjective/Objective Assessment:   27 yr old male self pay patient with 8 ED visits in the last 6 months [facility] staff informed CM pt inquired about MATCH medication assistance & confirmed he had recently used MATCH on 04/17/13 at [facility] outpt pharmacy Pt seen by [facility] on 04/18/13     Subjective/Objective Assessment Detail:   & 05/11/13 & today Pt has still not obtained a pcp His wife at bedside confirms  Refer to [facility] EPIC entry, ED PM CM and unit CM notes in EPIC for 03/2013     Action/Plan:   CM reviewed EPIC notes and PDMI online to confirm pt has entry in [facility] for MATCH use for 03/2013 and is not eligible for use until 2015   Action/Plan Detail:   Anticipated DC Date:  07/10/2013     Status Recommendation to Physician:   Result of Recommendation:    Other ED Services  Consult Working Plan    DC Planning Services  Other  PCP issues  [facility] / [facility] (established/new)  Medication Assistance    Choice offered to / List presented to:            Status of service:  Completed, signed off  ED Comments:   ED Comments Detail:

## 2013-07-10 NOTE — Progress Notes (Signed)
[facility] provided pt with a list of primary care resources, [facility] Orange Card application, and ACA information.  °

## 2013-10-28 ENCOUNTER — Emergency Department (HOSPITAL_COMMUNITY): Payer: Self-pay

## 2013-10-28 ENCOUNTER — Emergency Department (HOSPITAL_COMMUNITY)
Admission: EM | Admit: 2013-10-28 | Discharge: 2013-10-28 | Disposition: A | Discharge status: Home / Self Care | Payer: Self-pay | Attending: Emergency Medicine | Admitting: Emergency Medicine

## 2013-10-28 DIAGNOSIS — Z792 Long term (current) use of antibiotics: Secondary | ICD-10-CM | POA: Insufficient documentation

## 2013-10-28 DIAGNOSIS — F411 Generalized anxiety disorder: Secondary | ICD-10-CM | POA: Insufficient documentation

## 2013-10-28 DIAGNOSIS — Z791 Long term (current) use of non-steroidal anti-inflammatories (NSAID): Secondary | ICD-10-CM | POA: Insufficient documentation

## 2013-10-28 DIAGNOSIS — R072 Precordial pain: Secondary | ICD-10-CM | POA: Insufficient documentation

## 2013-10-28 DIAGNOSIS — Z79899 Other long term (current) drug therapy: Secondary | ICD-10-CM | POA: Insufficient documentation

## 2013-10-28 DIAGNOSIS — R079 Chest pain, unspecified: Secondary | ICD-10-CM

## 2013-10-28 DIAGNOSIS — F17200 Nicotine dependence, unspecified, uncomplicated: Secondary | ICD-10-CM | POA: Insufficient documentation

## 2013-10-28 DIAGNOSIS — R209 Unspecified disturbances of skin sensation: Secondary | ICD-10-CM | POA: Insufficient documentation

## 2013-10-28 DIAGNOSIS — IMO0002 Reserved for concepts with insufficient information to code with codable children: Secondary | ICD-10-CM | POA: Insufficient documentation

## 2013-10-28 DIAGNOSIS — J4489 Other specified chronic obstructive pulmonary disease: Secondary | ICD-10-CM | POA: Insufficient documentation

## 2013-10-28 DIAGNOSIS — J449 Chronic obstructive pulmonary disease, unspecified: Secondary | ICD-10-CM | POA: Insufficient documentation

## 2013-10-28 LAB — TROPONIN I

## 2013-10-28 MED ORDER — LORAZEPAM 2 MG/ML IJ SOLN
1.0000 mg | Freq: Once | INTRAMUSCULAR | Status: AC
  Administered 2013-10-28: 1 mg via INTRAVENOUS
  Filled 2013-10-28: qty 1

## 2013-10-28 NOTE — ED Notes (Signed)
Phlebotomy at bedside.

## 2013-10-28 NOTE — Discharge Instructions (Signed)
Chest Pain (Nonspecific) °It is often hard to give a specific diagnosis for the cause of chest pain. There is always a chance that your pain could be related to something serious, such as a heart attack or a blood clot in the lungs. You need to follow up with your caregiver for further evaluation. °CAUSES  °· Heartburn. °· Pneumonia or bronchitis. °· Anxiety or stress. °· Inflammation around your heart (pericarditis) or lung (pleuritis or pleurisy). °· A blood clot in the lung. °· A collapsed lung (pneumothorax). It can develop suddenly on its own (spontaneous pneumothorax) or from injury (trauma) to the chest. °· Shingles infection (herpes zoster virus). °The chest wall is composed of bones, muscles, and cartilage. Any of these can be the source of the pain. °· The bones can be bruised by injury. °· The muscles or cartilage can be strained by coughing or overwork. °· The cartilage can be affected by inflammation and become sore (costochondritis). °DIAGNOSIS  °Lab tests or other studies, such as X-rays, electrocardiography, stress testing, or cardiac imaging, may be needed to find the cause of your pain.  °TREATMENT  °· Treatment depends on what may be causing your chest pain. Treatment may include: °· Acid blockers for heartburn. °· Anti-inflammatory medicine. °· Pain medicine for inflammatory conditions. °· Antibiotics if an infection is present. °· You may be advised to change lifestyle habits. This includes stopping smoking and avoiding alcohol, caffeine, and chocolate. °· You may be advised to keep your head raised (elevated) when sleeping. This reduces the chance of acid going backward from your stomach into your esophagus. °· Most of the time, nonspecific chest pain will improve within 2 to 3 days with rest and mild pain medicine. °HOME CARE INSTRUCTIONS  °· If antibiotics were prescribed, take your antibiotics as directed. Finish them even if you start to feel better. °· For the next few days, avoid physical  activities that bring on chest pain. Continue physical activities as directed. °· Do not smoke. °· Avoid drinking alcohol. °· Only take over-the-counter or prescription medicine for pain, discomfort, or fever as directed by your caregiver. °· Follow your caregiver's suggestions for further testing if your chest pain does not go away. °· Keep any follow-up appointments you made. If you do not go to an appointment, you could develop lasting (chronic) problems with pain. If there is any problem keeping an appointment, you must call to reschedule. °SEEK MEDICAL CARE IF:  °· You think you are having problems from the medicine you are taking. Read your medicine instructions carefully. °· Your chest pain does not go away, even after treatment. °· You develop a rash with blisters on your chest. °SEEK IMMEDIATE MEDICAL CARE IF:  °· You have increased chest pain or pain that spreads to your arm, neck, jaw, back, or abdomen. °· You develop shortness of breath, an increasing cough, or you are coughing up blood. °· You have severe back or abdominal pain, feel nauseous, or vomit. °· You develop severe weakness, fainting, or chills. °· You have a fever. °THIS IS AN EMERGENCY. Do not wait to see if the pain will go away. Get medical help at once. Call your local emergency services (911 in [place] Do not drive yourself to the hospital. °MAKE SURE YOU:  °· Understand these instructions. °· Will watch your condition. °· Will get help right away if you are not doing well or get worse. °Document Released: 04/22/2005 Document Revised: 10/05/2011 Document Reviewed: 02/16/2008 °ExitCare® Patient Information ©2014 [employer]   LL[employer]. ° °

## 2013-10-28 NOTE — ED Notes (Signed)
Per EMS: pt coming from home with c/o left side chest pain, numbness in left arm and left neck, onset was 1330. Pt denies any anxiety with chest pain, denies n/v, no diaphorsis, pt reports hx of palpitations. Pt given nitro x 2, and 324 asa. Pain 7/10. Pt reports some pain relief when placed on 2 lpm O2. Pt is A&Ox4, respirations equal and unlabored skin warm an dry.

## 2013-10-28 NOTE — ED Provider Notes (Signed)
CSN: [id number]     Arrival date & time 10/28/13  0020 History   First MD Initiated Contact with Patient 10/28/13 0036     Chief Complaint  Patient presents with  . Chest Pain     (Consider location/radiation/quality/duration/timing/severity/associated sxs/prior Treatment) HPI Comments: 28 yo wm with pmh of COPD and Asthma presents to ER with cc of CP.  Symptoms began at 1330.  Pt states he is undergoing financial strains and has been very stressed.    EMS provided NTG x 2 and ASA.   Pt reports feeling "tingly and numb all over"  No HTN, no hyperlipidemia, Pt smokes daily, no DM, no obesity.      Patient is a 28 y.o. male presenting with chest pain. The history is provided by the patient and the EMS personnel.  Chest Pain Pain location:  Substernal area and L chest Pain quality: aching   Pain radiates to:  Does not radiate Pain radiates to the back: no   Pain severity:  Moderate Onset quality:  Gradual Duration:  10 hours Timing:  Constant Progression:  Partially resolved Chronicity:  Recurrent Context: stress   Relieved by: EMS treatment. Worsened by:  Nothing tried Ineffective treatments:  None tried Associated symptoms: anxiety   Associated symptoms: no abdominal pain, no AICD problem, no altered mental status, no anorexia, no back pain, no claudication, no cough, no dizziness, no dysphagia, no fatigue, no fever, no headache, no heartburn, no lower extremity edema, no nausea, no numbness, no orthopnea, no palpitations, no PND, no shortness of breath, no syncope, not vomiting and no weakness     Past Medical History  Diagnosis Date  . Asthma   . COPD (chronic obstructive pulmonary disease)    Past Surgical History  Procedure Laterality Date  . Femur fracture surgery  as child  . Laparoscopic appendectomy N/A 01/29/2013    Procedure: APPENDECTOMY LAPAROSCOPIC;  Surgeon: Cherylynn RidgesJames O Wyatt, MD;  [facility];  Service: General;  Laterality: N/A;  . Appendectomy    .  Tonsillectomy     Family History  Problem Relation Age of Onset  . Hypertension Mother   . Diabetes Mother   . Hypertension Other    History  Substance Use Topics  . Smoking status: Current Every Day Smoker -- 0.50 packs/day for 7 years    Types: Cigarettes  . Smokeless tobacco: Former NeurosurgeonUser    Quit date: 11/06/2010  . Alcohol Use: Yes     Comment: occ    Review of Systems  Constitutional: Negative.  Negative for fever and fatigue.  HENT: Negative.  Negative for trouble swallowing.   Eyes: Negative.   Respiratory: Negative.  Negative for cough and shortness of breath.   Cardiovascular: Positive for chest pain. Negative for palpitations, orthopnea, claudication, leg swelling, syncope and PND.  Gastrointestinal: Negative.  Negative for heartburn, nausea, vomiting, abdominal pain and anorexia.  Endocrine: Negative.   Genitourinary: Negative.   Musculoskeletal: Negative for back pain.  Neurological: Negative for dizziness, weakness, numbness and headaches.  Psychiatric/Behavioral: Positive for agitation. Negative for suicidal ideas, hallucinations, confusion, sleep disturbance, self-injury, dysphoric mood and decreased concentration. The patient is not nervous/anxious and is not hyperactive.        Anxious      Allergies  Codeine  Home Medications   Current Outpatient Rx  Name  Route  Sig  Dispense  Refill  . albuterol (PROVENTIL HFA;VENTOLIN HFA) 108 (90 BASE) MCG/ACT inhaler   Inhalation   Inhale 2 puffs into  the lungs every 4 (four) hours as needed for wheezing or shortness of breath.   1 Inhaler   0   . albuterol (PROVENTIL HFA;VENTOLIN HFA) 108 (90 BASE) MCG/ACT inhaler   Inhalation   Inhale 2 puffs into the lungs every 4 (four) hours as needed for wheezing or shortness of breath.   6.7 g   0   . amoxicillin (AMOXIL) 500 MG capsule   Oral   Take 1 capsule (500 mg total) by mouth 3 (three) times daily.   30 capsule   0   . ibuprofen (ADVIL,MOTRIN) 200 MG  tablet   Oral   Take 200 mg by mouth every 6 (six) hours as needed.         . naproxen sodium (ANAPROX) 220 MG tablet   Oral   Take 220 mg by mouth 2 (two) times daily with a meal.         . sulfamethoxazole-trimethoprim (BACTRIM DS) 800-160 MG per tablet   Oral   Take 1 tablet by mouth 2 (two) times daily.          BP 110/70  Pulse 57  Temp(Src) 97.7 F (36.5 C) (Oral)  Resp 21  SpO2 99% Physical Exam  Nursing note and vitals reviewed. Constitutional: He is oriented to person, place, and time. He appears well-developed and well-nourished. No distress.  HENT:  Head: Normocephalic and atraumatic.  Mouth/Throat: Oropharynx is clear and moist.  Eyes: Conjunctivae are normal. Right eye exhibits no discharge. Left eye exhibits no discharge.  Neck: Neck supple.  Cardiovascular: Normal rate and regular rhythm.  Exam reveals no friction rub.   No murmur heard. Pulmonary/Chest: Effort normal and breath sounds normal. He has no wheezes. He has no rales. He exhibits no tenderness.  Abdominal: Soft. Bowel sounds are normal. He exhibits no distension and no mass. There is no tenderness. There is no rebound and no guarding.  Musculoskeletal: Normal range of motion.  Neurological: He is alert and oriented to person, place, and time. He has normal reflexes. No cranial nerve deficit. Coordination normal.  Skin: He is not diaphoretic.    ED Course  Procedures (including critical care time) Labs Review Labs Reviewed  TROPONIN I   Imaging Review Dg Chest 2 View  10/28/2013   CLINICAL DATA:  Chest pain and shortness of breath. History of smoking.  EXAM: CHEST  2 VIEW  COMPARISON:  Chest radiograph performed 07/08/2013  FINDINGS: The lungs are well-aerated and clear. There is no evidence of focal opacification, pleural effusion or pneumothorax.  The heart is normal in size; the mediastinal contour is within normal limits. No acute osseous abnormalities are seen.  IMPRESSION: No acute  cardiopulmonary process seen.   Electronically Signed   By: Roanna Raider M.D.   On: 10/28/2013 03:16     EKG Interpretation   Date/Time:  Saturday October 28 2013 00:34:10 EDT Ventricular Rate:  69 PR Interval:  132 QRS Duration: 108 QT Interval:  398 QTC Calculation: 426 R Axis:   61 Text Interpretation:  Sinus rhythm RSR' in V1 or V2, right VCD or RVH  Baseline wander in lead(s) III aVF Confirmed by [facility]  MD, Shantell Belongia ([id number])  on 10/28/2013 12:47:38 AM      MDM   Final diagnoses:  Chest pain   Roy Wilson is a 27 yo wm with h/o asthma who presents to ER with cc of CP.  Onset 1330 and constant.  Pt is very low risk for ACS or  PE.  (PERC negative and very low HEART score)  ER course: AFVSS.  Benign exam.  Negative EKG, CXR, trop x 1 (single trop adequate based on ACS risk and constant pain for approx 12 hrs). Symptoms improved with ativan.    Doubt ACS, PE, AD, Pneumothorax, GI emergency, or other emergent etiology. I suspect the symptoms are multifactorial.  Pt and wife agree with plan.  Strict ED return precautions were given.  Pt should f/u with outpatient clinic within 3 days.  [facility] f/u placed.  He would benefit from an outpt cardiac risk stratification.    The patient appears reasonably screened and/or stabilized for discharge and I doubt any other medical condition or other [facility] requiring further screening, evaluation, or treatment in the ED at this time prior to discharge.    Darlys Gales, MD 10/28/13 [phone]

## 2013-10-28 NOTE — ED Notes (Signed)
Pt. Reports left sided chest pain with radiation of tingling/numbness to left/right arm, neck, jaw. Hx of being seen for palpitation. Pt. Appear anxious. Breath sounds clear. Placed on 2L O2 for comfort.

## 2014-06-28 IMAGING — CR DG CHEST 2V
2 series · 2 of 2 positions shown · non-contrast
Comparison: 04/22/2013

CLINICAL DATA: Left-sided chest pain, shortness of breath

EXAM:
CHEST  2 VIEW

[w chest lat]
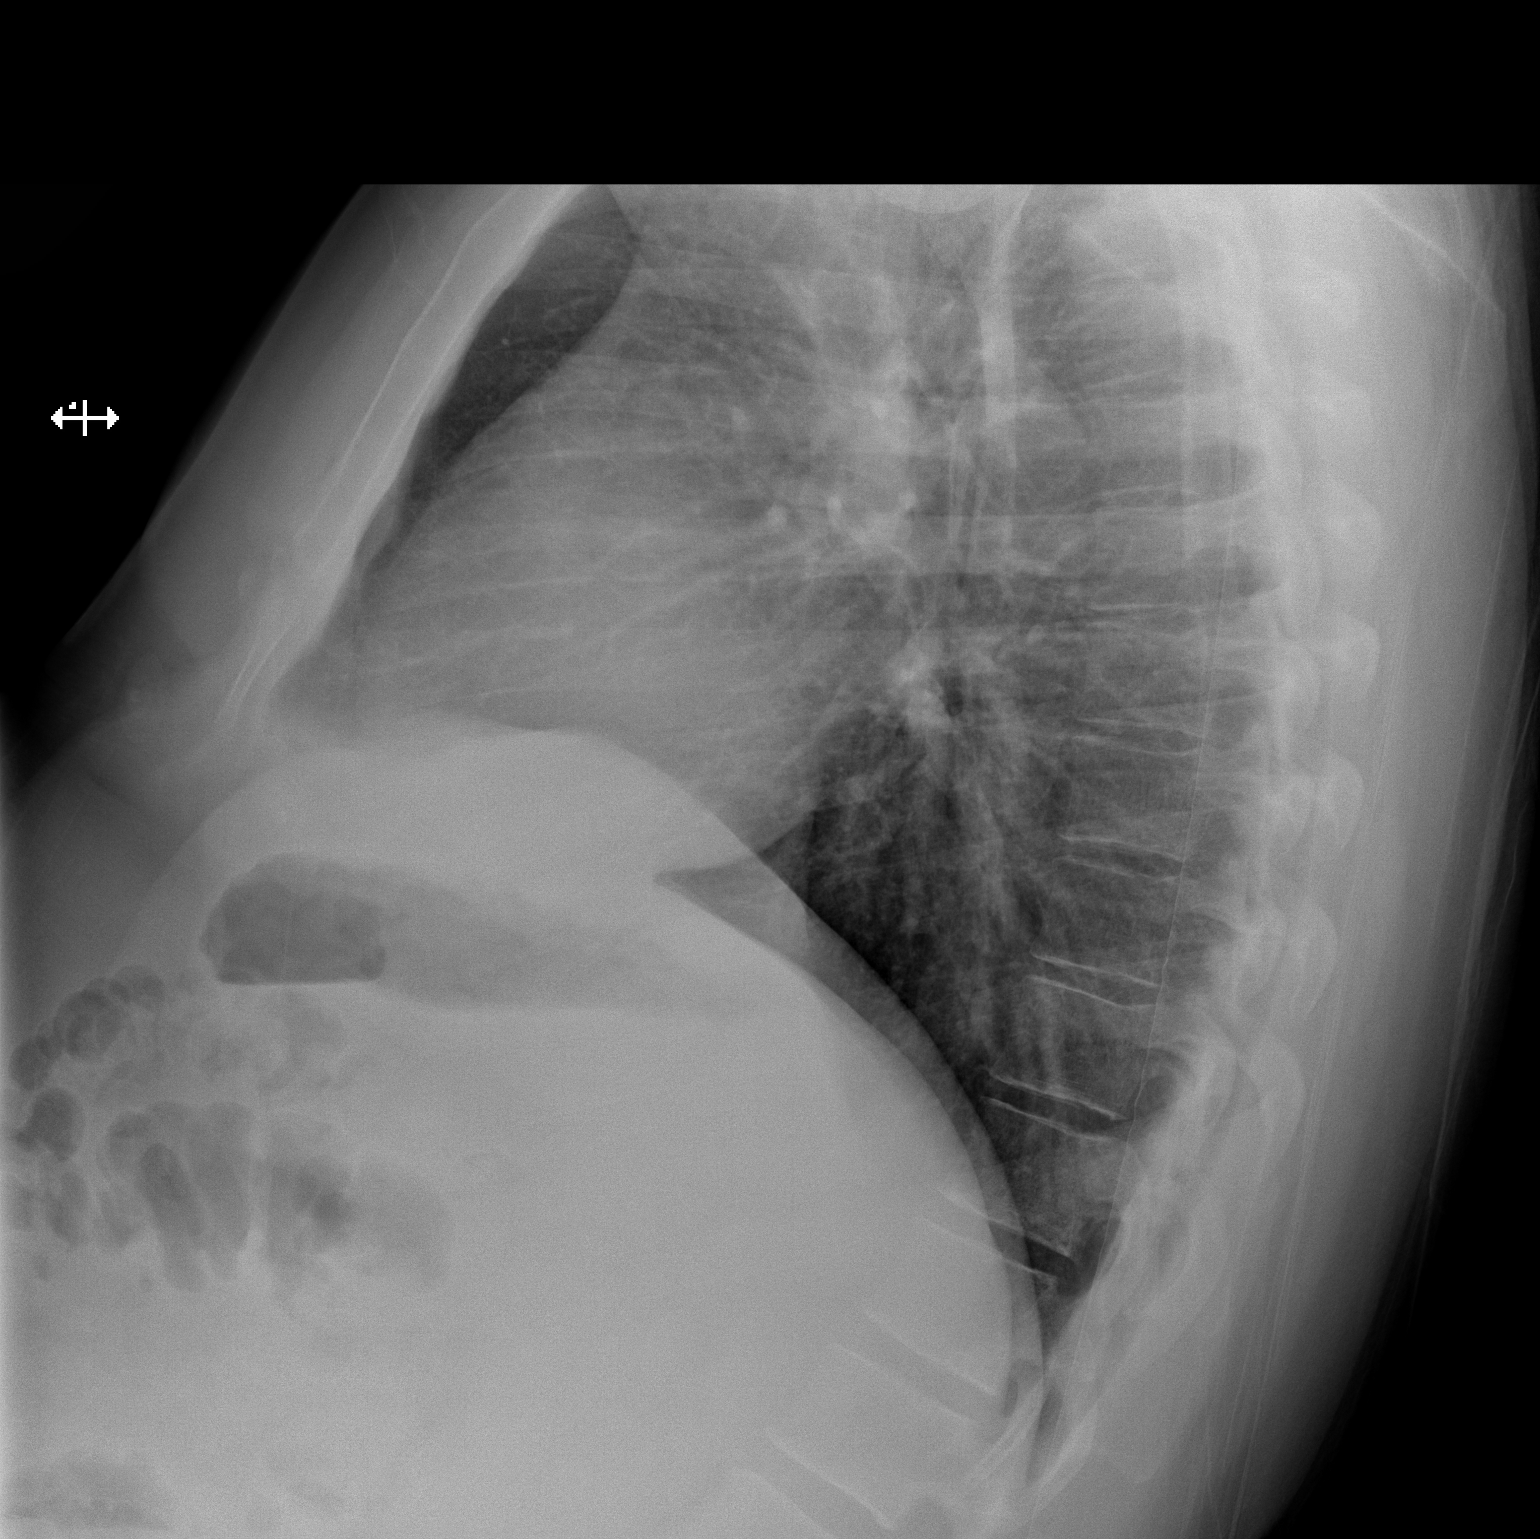

[x chest ap]
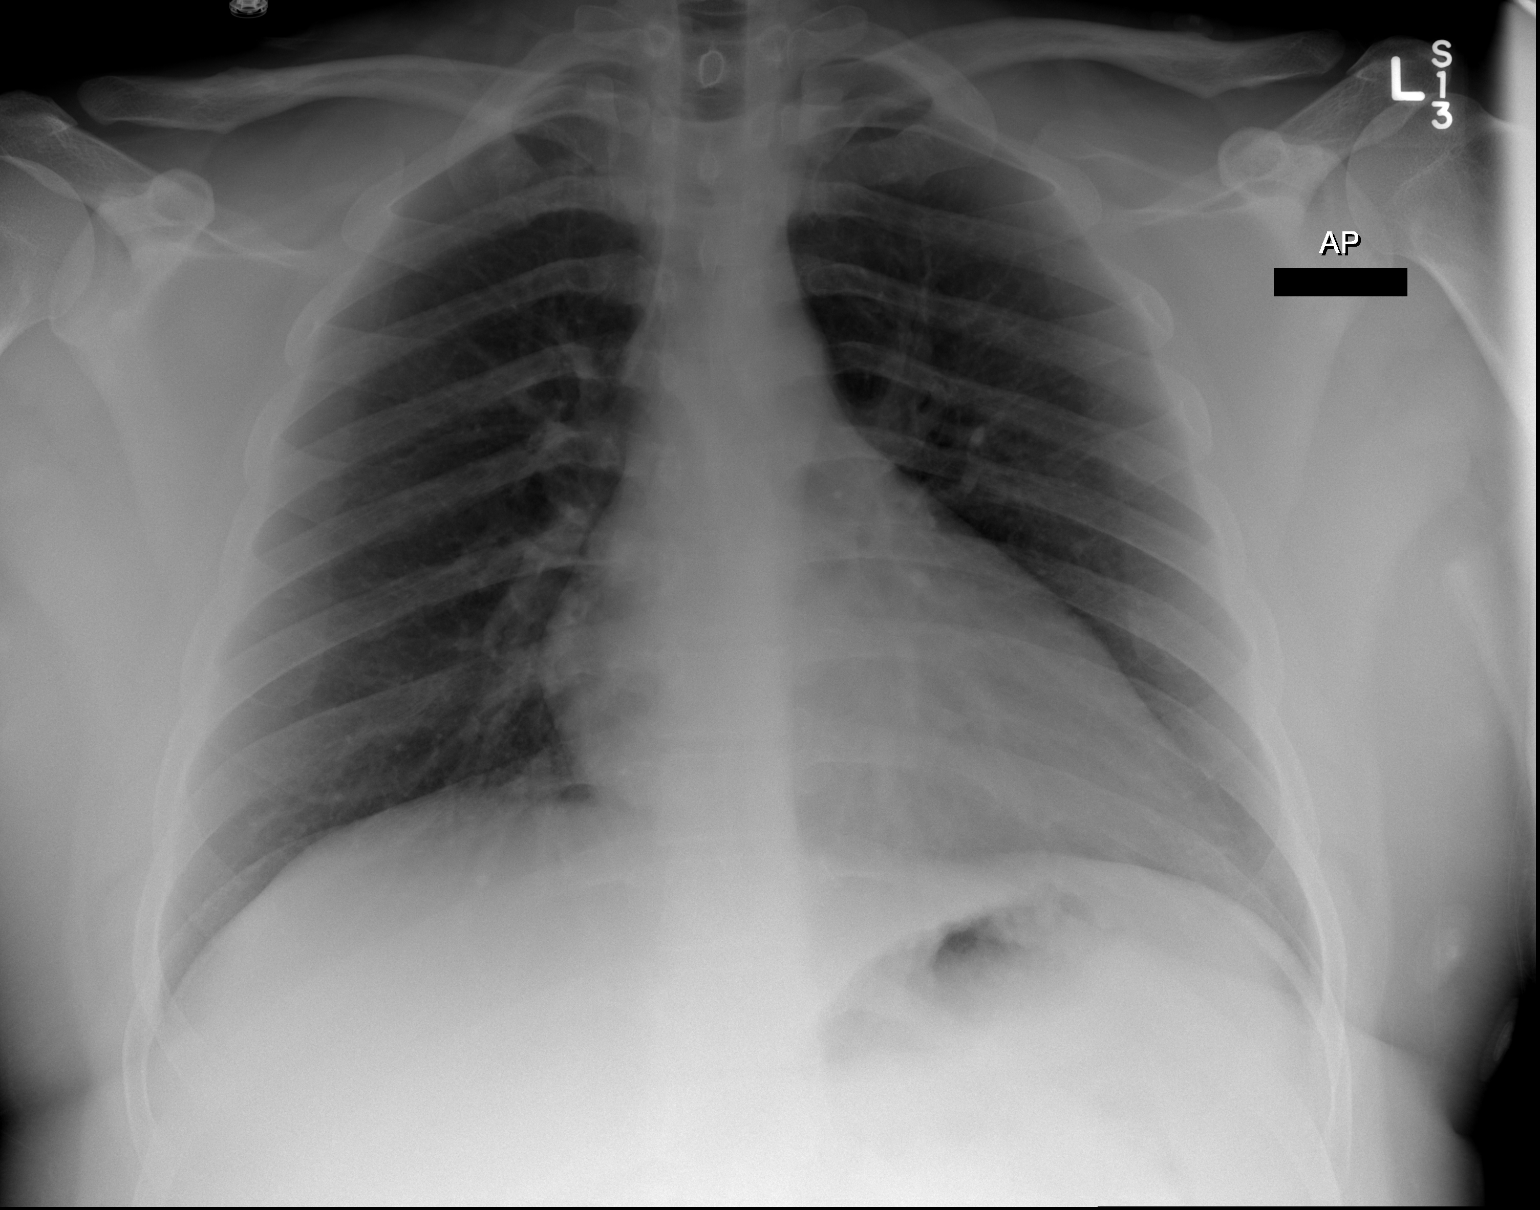

[2 of 2 positions shown; findings below may reference images not displayed]

FINDINGS: The heart size and mediastinal contours are within normal limits.
Both lungs are clear. The visualized skeletal structures are
unremarkable.
IMPRESSION: No active cardiopulmonary disease.

## 2014-07-12 ENCOUNTER — Emergency Department (HOSPITAL_COMMUNITY)
Admission: EM | Admit: 2014-07-12 | Discharge: 2014-07-12 | Disposition: A | Discharge status: Home / Self Care | Payer: Self-pay | Attending: Emergency Medicine | Admitting: Emergency Medicine

## 2014-07-12 ENCOUNTER — Emergency Department (HOSPITAL_COMMUNITY): Payer: Self-pay

## 2014-07-12 ENCOUNTER — Encounter (HOSPITAL_COMMUNITY): Payer: Self-pay | Admitting: Emergency Medicine

## 2014-07-12 DIAGNOSIS — J449 Chronic obstructive pulmonary disease, unspecified: Secondary | ICD-10-CM | POA: Insufficient documentation

## 2014-07-12 DIAGNOSIS — Z8781 Personal history of (healed) traumatic fracture: Secondary | ICD-10-CM | POA: Insufficient documentation

## 2014-07-12 DIAGNOSIS — F17200 Nicotine dependence, unspecified, uncomplicated: Secondary | ICD-10-CM

## 2014-07-12 DIAGNOSIS — F17213 Nicotine dependence, cigarettes, with withdrawal: Secondary | ICD-10-CM | POA: Insufficient documentation

## 2014-07-12 DIAGNOSIS — F419 Anxiety disorder, unspecified: Secondary | ICD-10-CM | POA: Insufficient documentation

## 2014-07-12 DIAGNOSIS — R42 Dizziness and giddiness: Secondary | ICD-10-CM

## 2014-07-12 DIAGNOSIS — Z79899 Other long term (current) drug therapy: Secondary | ICD-10-CM | POA: Insufficient documentation

## 2014-07-12 LAB — BASIC METABOLIC PANEL
Anion gap: 16 — ABNORMAL HIGH (ref 5–15)
BUN: 7 mg/dL (ref 6–23)
CHLORIDE: 99 meq/L (ref 96–112)
CO2: 25 mEq/L (ref 19–32)
Calcium: 9.9 mg/dL (ref 8.4–10.5)
Creatinine, Ser: 0.82 mg/dL (ref 0.50–1.35)
GFR calc non Af Amer: >90 mL/min (ref >90)
GLUCOSE: 92 mg/dL (ref 70–99)
POTASSIUM: 3.9 meq/L (ref 3.7–5.3)
Sodium: 140 mEq/L (ref 137–147)

## 2014-07-12 LAB — CBC
HEMATOCRIT: 46.6 % (ref 39.0–52.0)
HEMOGLOBIN: 15.9 g/dL (ref 13.0–17.0)
MCH: 31.7 pg (ref 26.0–34.0)
MCHC: 34.1 g/dL (ref 30.0–36.0)
MCV: 93 fL (ref 78.0–100.0)
Platelets: 246 10*3/uL (ref 150–400)
RBC: 5.01 MIL/uL (ref 4.22–5.81)
RDW: 13.1 % (ref 11.5–15.5)
WBC: 7.5 10*3/uL (ref 4.0–10.5)

## 2014-07-12 LAB — TROPONIN I

## 2014-07-12 MED ORDER — LORAZEPAM 1 MG PO TABS: ORAL_TABLET | ORAL | Status: DC

## 2014-07-12 MED ORDER — HYDROCODONE-ACETAMINOPHEN 5-325 MG PO TABS
1.0000 | ORAL_TABLET | Freq: Once | ORAL | Status: AC
  Administered 2014-07-12: 1 via ORAL
  Filled 2014-07-12: qty 1

## 2014-07-12 MED ORDER — SODIUM CHLORIDE 0.9 % IV BOLUS (SEPSIS): 1000.0000 mL | INTRAVENOUS | Status: DC

## 2014-07-12 MED ORDER — KETOROLAC TROMETHAMINE 30 MG/ML IJ SOLN
30.0000 mg | Freq: Once | INTRAMUSCULAR | Status: AC
  Administered 2014-07-12: 30 mg via INTRAVENOUS
  Filled 2014-07-12: qty 1

## 2014-07-12 MED ORDER — LORAZEPAM 1 MG PO TABS
1.0000 mg | ORAL_TABLET | Freq: Once | ORAL | Status: AC
  Administered 2014-07-12: 1 mg via ORAL
  Filled 2014-07-12: qty 1

## 2014-07-12 NOTE — ED Provider Notes (Signed)
CSN: [id number]     Arrival date & time 07/12/14  1736 History   First MD Initiated Contact with Patient 07/12/14 1810     Chief Complaint  Patient presents with  . Pressure Behind the Eyes  . Anxiety     (Consider location/radiation/quality/duration/timing/severity/associated sxs/prior Treatment) The history is provided by the patient, a friend and medical records. No language interpreter was used.     Roy Wilson is a 28 y.o. male  with a hx of asthma, COPD, anxiety presents to the Emergency Department complaining of gradual, persistent, progressively worsening bilateral headache described as pressure onset 6am this morning.  Pt reports he took 1 ASA without relief.  He denies Hx of headaches in the past.  Pt reports he stopped smoking cold [place] 3 days ago, but was used to smoking 1ppd.  He reports he has not had URI symptoms.  He reports he is coughing in the morning, but this is normal.  Associated symptoms include feeling jittery and anxious with heart palpitations and feeling the room spinning when he stands up.  Pt reports he was seen for anxiety earlier this year and d/c home ativan.  He reports he completed this and was unable to follow-up due to insurance issues. Pt reports he has associated left arm "heaviness."  He denies SOB or CP.  He reports this started at 6am as well and had been constant all day.  He denies a personal Hx of cardiac problems, Pt reports father had an unknown cardiac condition, but no one in the family ever died suddenly at a young age.  Nothing makes it better and nothing makes it worse.  Pt denies fever, chills, neck pain, neck stiffness, CP, abd pain, N/V/D, weakness, dizziness, syncope.      Past Medical History  Diagnosis Date  . Asthma   . COPD (chronic obstructive pulmonary disease)    Past Surgical History  Procedure Laterality Date  . Femur fracture surgery  as child  . Laparoscopic appendectomy N/A 01/29/2013    Procedure: APPENDECTOMY  LAPAROSCOPIC;  Surgeon: Cherylynn RidgesJames O Wyatt, MD;  [facility];  Service: General;  Laterality: N/A;  . Appendectomy    . Tonsillectomy     Family History  Problem Relation Age of Onset  . Hypertension Mother   . Diabetes Mother   . Hypertension Other    History  Substance Use Topics  . Smoking status: Current Every Day Smoker -- 0.50 packs/day for 7 years    Types: Cigarettes  . Smokeless tobacco: Former NeurosurgeonUser    Quit date: 11/06/2010  . Alcohol Use: Yes     Comment: occ    Review of Systems  Constitutional: Negative for fever, diaphoresis, appetite change, fatigue and unexpected weight change.  HENT: Negative for mouth sores.   Eyes: Negative for visual disturbance.  Respiratory: Positive for cough (chronic, morning) and chest tightness. Negative for shortness of breath and wheezing.   Cardiovascular: Positive for palpitations. Negative for chest pain.  Gastrointestinal: Negative for nausea, vomiting, abdominal pain, diarrhea and constipation.  Endocrine: Negative for polydipsia, polyphagia and polyuria.  Genitourinary: Negative for dysuria, urgency, frequency and hematuria.  Musculoskeletal: Negative for back pain and neck stiffness.  Skin: Negative for rash.  Allergic/Immunologic: Negative for immunocompromised state.  Neurological: Positive for dizziness and headaches. Negative for syncope and light-headedness.  Hematological: Does not bruise/bleed easily.  Psychiatric/Behavioral: Negative for sleep disturbance. The patient is nervous/anxious.       Allergies  Codeine  Home Medications  Prior to Admission medications   Medication Sig Start Date End Date Taking? Authorizing Provider  albuterol (PROVENTIL HFA;VENTOLIN HFA) 108 (90 BASE) MCG/ACT inhaler Inhale 2 puffs into the lungs every 4 (four) hours as needed for wheezing or shortness of breath. Patient not taking: Reported on 07/12/2014 05/27/13   Jillyn Ledger, PA-C  albuterol (PROVENTIL HFA;VENTOLIN HFA) 108  (90 BASE) MCG/ACT inhaler Inhale 2 puffs into the lungs every 4 (four) hours as needed for wheezing or shortness of breath. 07/10/13   Ward Givens, MD  amoxicillin (AMOXIL) 500 MG capsule Take 1 capsule (500 mg total) by mouth 3 (three) times daily. Patient not taking: Reported on 07/12/2014 07/10/13   Ward Givens, MD  aspirin 325 MG tablet Take 325 mg by mouth every 4 (four) hours as needed for moderate pain or headache (headache).   Yes Historical Provider, MD  ibuprofen (ADVIL,MOTRIN) 200 MG tablet Take 200 mg by mouth every 6 (six) hours as needed.    Historical Provider, MD  LORazepam (ATIVAN) 1 MG tablet Up to 3 times daily as needed for anxiety 07/12/14   [facility] Elayjah Chaney, PA-C  pantoprazole (PROTONIX) 20 MG tablet Take 20 mg by mouth daily as needed for heartburn (heartburn).   Yes Historical Provider, MD   BP 145/91 mmHg  Pulse 85  Temp(Src) 98.9 F (37.2 C) (Oral)  Resp 18  Ht 5\' 9"  (1.753 m)  Wt 213 lb (96.616 kg)  BMI 31.44 kg/m2  SpO2 99% Physical Exam  Constitutional: He is oriented to person, place, and time. He appears well-developed and well-nourished. No distress.  Awake, alert, nontoxic appearance  HENT:  Head: Normocephalic and atraumatic.  Mouth/Throat: Oropharynx is clear and moist. No oropharyngeal exudate.  Eyes: Conjunctivae and EOM are normal. Pupils are equal, round, and reactive to light. No scleral icterus.  No horizontal, vertical or rotational nystagmus  Neck: Normal range of motion. Neck supple.  Full active and passive ROM without pain No midline or paraspinal tenderness No nuchal rigidity or meningeal signs  Cardiovascular: Normal rate, regular rhythm, normal heart sounds and intact distal pulses.   No murmur heard. No tachycardia  Pulmonary/Chest: Effort normal. No respiratory distress. He has decreased breath sounds. He has no wheezes. He has no rales.  Equal chest expansion Slightly diminished throughout without focal wheezes, rhonchi or rales   Abdominal: Soft. Bowel sounds are normal. He exhibits no mass. There is no tenderness. There is no rebound and no guarding.  Musculoskeletal: Normal range of motion. He exhibits no edema.  Lymphadenopathy:    He has no cervical adenopathy.  Neurological: He is alert and oriented to person, place, and time. He has normal reflexes. No cranial nerve deficit. He exhibits normal muscle tone. Coordination normal.  Mental Status:  Alert, oriented, thought content appropriate. Speech fluent without evidence of aphasia. Able to follow 2 step commands without difficulty.  Cranial Nerves:  II:  Peripheral visual fields grossly normal, pupils equal, round, reactive to light III,IV, VI: ptosis not present, extra-ocular motions intact bilaterally  V,VII: smile symmetric, facial light touch sensation equal VIII: hearing grossly normal bilaterally  IX,X: gag reflex present  XI: bilateral shoulder shrug equal and strong XII: midline tongue extension  Motor:  5/5 in upper and lower extremities bilaterally including strong and equal grip strength and dorsiflexion/plantar flexion Sensory: Pinprick and light touch normal in all extremities.  Deep Tendon Reflexes: 2+ and symmetric  Cerebellar: normal finger-to-nose with bilateral upper extremities Gait: normal gait and balance CV:  distal pulses palpable throughout   Skin: Skin is warm and dry. No rash noted. He is not diaphoretic.  Psychiatric: He has a normal mood and affect. His behavior is normal. Judgment and thought content normal.  Nursing note and vitals reviewed.   ED Course  Procedures (including critical care time) Labs Review Labs Reviewed  BASIC METABOLIC PANEL - Abnormal; Notable for the following:    Anion gap 16 (*)    All other components within normal limits  CBC  TROPONIN I    Imaging Review Dg Chest 2 View  07/12/2014   CLINICAL DATA:  Anxious and shaky after quitting smoking  EXAM: CHEST  2 VIEW  COMPARISON:  10/28/2013   FINDINGS: The heart size and mediastinal contours are within normal limits. Both lungs are clear. The visualized skeletal structures are unremarkable.  IMPRESSION: No active cardiopulmonary disease.   Electronically Signed   By: Signa Kellaylor  Stroud M.D.   On: 07/12/2014 20:10     EKG Interpretation   Date/Time:  Thursday July 12 2014 18:53:34 EST Ventricular Rate:  81 PR Interval:  151 QRS Duration: 107 QT Interval:  386 QTC Calculation: 448 R Axis:   64 Text Interpretation:  Sinus rhythm RSR' in V1 or V2, right VCD or RVH No  significant change since last tracing Confirmed by [facility]  MD, MARTHA  [phone]([id number]) on 07/12/2014 7:22:45 PM         MDM   Final diagnoses:  Heavy smoker  Dizziness  Anxiety  Cigarette nicotine dependence with withdrawal   Roy Wilson presents with anxiety, palpitations, dizziness, and left arm heaviness.  Pt low risk for cardiac disease.  PERC negative.  Doubt ACS or PE, but will obtain labs, treat headache and anxiety.  Pt's ssx likely 2/2 nicotine withdrawal.  8:30PM Labs reassuring, troponin negative and EKG unchanged from previous. Patient reports feeling better with resolution of palpitations left arm heaviness after Ativan administration. He reports significant improvement in his headache as well but it is not completely resolved. He is tolerating by mouth here in the emergency department and has ambulated without difficulty and without return of his dizziness.  Will give vicodin for persistent headache.  Pt remains neurologically intact.  9:25 PM Patient with complete resolution of his headache and feeling much better. He continues to ambulate without return of his dizziness. Discussed patient's symptoms most likely secondary to nicotine withdrawal however potentially from anxiety as well. Discussed use of nicotine patch with decreasing strength to wean himself off the nicotine however patient wishes to remain in nonsmoker after stopping cold [place].   Will give a short course of Ativan to assist with symptoms and anxiety.  I discussed the importance of close follow-up at length with the patient who reports [facility]  I have personally reviewed patient's vitals, nursing note and any pertinent labs or imaging.  I performed an undressed physical exam.    It has been determined that no acute conditions requiring further emergency intervention are present at this time. The patient/guardian have been advised of the diagnosis and plan. I reviewed all labs and imaging including any potential incidental findings. We have discussed signs and symptoms that warrant return to the ED and they are listed in the discharge instructions.    Vital signs are stable at discharge.   BP 145/91 mmHg  Pulse 85  Temp(Src) 98.9 F (37.2 C) (Oral)  Resp 18  Ht 5\' 9"  (1.753 m)  Wt 213  lb (96.616 kg)  BMI 31.44 kg/m2  SpO2 99%         Dierdre Forth, PA-C 07/12/14 2127  Ethelda Chick, MD 07/12/14 2137

## 2014-07-12 NOTE — ED Notes (Signed)
Pt A+Ox4, reports "quit smoking cold [place]" x3 days ago, reports feeling shaky and anxious, reports R sided head pressure, 6/10.  Pt reports +dizziness, also arm heaviness.  Pt denies cp/palpitations, sob.  Speaking full/clear sentences, rr even/un-lab.  MAEI.  +csm/+pulses.  NAD.

## 2014-07-12 NOTE — Discharge Instructions (Signed)
1. Medications: ativan, usual home medications 2. Treatment: rest, drink plenty of fluids, use nicotine patch to assist with withdrawal symptoms 3. Follow Up: Please followup with [facility] in 7 days for discussion of your diagnoses and further evaluation after today's visit; if you do not have a primary care doctor use the resource guide provided to find one; Please return to the ER for syncope, persistent palpitations, CP or other concerning symptoms   Smoking Cessation Quitting smoking is important to your health and has many advantages. However, it is not always easy to quit since nicotine is a very addictive drug. Oftentimes, people try 3 times or more before being able to quit. This document explains the best ways for you to prepare to quit smoking. Quitting takes hard work and a lot of effort, but you can do it. ADVANTAGES OF QUITTING SMOKING  You will live longer, feel better, and live better.  Your body will feel the impact of quitting smoking almost immediately.  Within 20 minutes, blood pressure decreases. Your pulse returns to its normal level.  After 8 hours, carbon monoxide levels in the blood return to normal. Your oxygen level increases.  After 24 hours, the chance of having a heart attack starts to decrease. Your breath, hair, and body stop smelling like smoke.  After 48 hours, damaged nerve endings begin to recover. Your sense of taste and smell improve.  After 72 hours, the body is virtually free of nicotine. Your bronchial tubes relax and breathing becomes easier.  After 2 to 12 weeks, lungs can hold more air. Exercise becomes easier and circulation improves.  The risk of having a heart attack, stroke, cancer, or lung disease is greatly reduced.  After 1 year, the risk of coronary heart disease is cut in half.  After 5 years, the risk of stroke falls to the same as a nonsmoker.  After 10 years, the risk of lung cancer is cut in half and the risk of  other cancers decreases significantly.  After 15 years, the risk of coronary heart disease drops, usually to the level of a nonsmoker.  If you are pregnant, quitting smoking will improve your chances of having a healthy baby.  The people you live with, especially any children, will be healthier.  You will have extra money to spend on things other than cigarettes. QUESTIONS TO THINK ABOUT BEFORE ATTEMPTING TO QUIT You may want to talk about your answers with your health care provider.  Why do you want to quit?  If you tried to quit in the past, what helped and what did not?  What will be the most difficult situations for you after you quit? How will you plan to handle them?  Who can help you through the tough times? Your family? Friends? A health care provider?  What pleasures do you get from smoking? What ways can you still get pleasure if you quit? Here are some questions to ask your health care provider:  How can you help me to be successful at quitting?  What medicine do you think would be best for me and how should I take it?  What should I do if I need more help?  What is smoking withdrawal like? How can I get information on withdrawal? GET READY  Set a quit date.  Change your environment by getting rid of all cigarettes, ashtrays, matches, and lighters in your home, car, or work. Do not let people smoke in your home.  Review your  past attempts to quit. Think about what worked and what did not. GET SUPPORT AND ENCOURAGEMENT You have a better chance of being successful if you have help. You can get support in many ways.  Tell your family, friends, and coworkers that you are going to quit and need their support. Ask them not to smoke around you.  Get individual, group, or telephone counseling and support. Programs are available at [facility] and health centers. Call your local health department for information about programs in your area.  Spiritual beliefs and  practices may help some smokers quit.  Download a "quit meter" on your computer to keep track of quit statistics, such as how long you have gone without smoking, cigarettes not smoked, and money saved.  Get a self-help book about quitting smoking and staying off tobacco. LEARN NEW SKILLS AND BEHAVIORS  Distract yourself from urges to smoke. Talk to someone, go for a walk, or occupy your time with a task.  Change your normal routine. Take a different route to work. Drink tea instead of coffee. Eat breakfast in a different place.  Reduce your stress. Take a hot bath, exercise, or read a book.  Plan something enjoyable to do every day. Reward yourself for not smoking.  Explore interactive web-based programs that specialize in helping you quit. GET MEDICINE AND USE IT CORRECTLY Medicines can help you stop smoking and decrease the urge to smoke. Combining medicine with the above behavioral methods and support can greatly increase your chances of successfully quitting smoking.  Nicotine replacement therapy helps deliver nicotine to your body without the negative effects and risks of smoking. Nicotine replacement therapy includes nicotine gum, lozenges, inhalers, nasal sprays, and skin patches. Some may be available over-the-counter and others require a prescription.  Antidepressant medicine helps people abstain from smoking, but how this works is unknown. This medicine is available by prescription.  Nicotinic receptor partial agonist medicine simulates the effect of nicotine in your brain. This medicine is available by prescription. Ask your health care provider for advice about which medicines to use and how to use them based on your health history. Your health care provider will tell you what side effects to look out for if you choose to be on a medicine or therapy. Carefully read the information on the package. Do not use any other product containing nicotine while using a nicotine replacement  product.  RELAPSE OR DIFFICULT SITUATIONS Most relapses occur within the first 3 months after quitting. Do not be discouraged if you start smoking again. Remember, most people try several times before finally quitting. You may have symptoms of withdrawal because your body is used to nicotine. You may crave cigarettes, be irritable, feel very hungry, cough often, get headaches, or have difficulty concentrating. The withdrawal symptoms are only temporary. They are strongest when you first quit, but they will go away within 10-14 days. To reduce the chances of relapse, try to:  Avoid drinking alcohol. Drinking lowers your chances of successfully quitting.  Reduce the amount of caffeine you consume. Once you quit smoking, the amount of caffeine in your body increases and can give you symptoms, such as a rapid heartbeat, sweating, and anxiety.  Avoid smokers because they can make you want to smoke.  Do not let weight gain distract you. Many smokers will gain weight when they quit, usually less than 10 pounds. Eat a healthy diet and stay active. You can always lose the weight gained after you quit.  Find ways to  improve your mood other than smoking. FOR MORE INFORMATION  www.smokefree.gov  Document Released: 07/07/2001 Document Revised: 11/27/2013 Document Reviewed: 10/22/2011 [facility] Patient Information 2015 [place], [place]. This information is not intended to replace advice given to you by your health care provider. Make sure you discuss any questions you have with your health care provider.    Smoking Cessation, Tips for Success If you are ready to quit smoking, congratulations! You have chosen to help yourself be healthier. Cigarettes bring nicotine, tar, carbon monoxide, and other irritants into your body. Your lungs, heart, and blood vessels will be able to work better without these poisons. There are many different ways to quit smoking. Nicotine gum, nicotine patches, a nicotine inhaler, or  nicotine nasal spray can help with physical craving. Hypnosis, support groups, and medicines help break the habit of smoking. WHAT THINGS CAN I DO TO MAKE QUITTING EASIER?  Here are some tips to help you quit for good:  Pick a date when you will quit smoking completely. Tell all of your friends and family about your plan to quit on that date.  Do not try to slowly cut down on the number of cigarettes you are smoking. Pick a quit date and quit smoking completely starting on that day.  Throw away all cigarettes.   Clean and remove all ashtrays from your home, work, and car.  On a card, write down your reasons for quitting. Carry the card with you and read it when you get the urge to smoke.  Cleanse your body of nicotine. Drink enough water and fluids to keep your urine clear or pale yellow. Do this after quitting to flush the nicotine from your body.  Learn to predict your moods. Do not let a bad situation be your excuse to have a cigarette. Some situations in your life might tempt you into wanting a cigarette.  Never have "just one" cigarette. It leads to wanting another and another. Remind yourself of your decision to quit.  Change habits associated with smoking. If you smoked while driving or when feeling stressed, try other activities to replace smoking. Stand up when drinking your coffee. Brush your teeth after eating. Sit in a different chair when you read the paper. Avoid alcohol while trying to quit, and try to drink fewer caffeinated beverages. Alcohol and caffeine may urge you to smoke.  Avoid foods and drinks that can trigger a desire to smoke, such as sugary or spicy foods and alcohol.  Ask people who smoke not to smoke around you.  Have something planned to do right after eating or having a cup of coffee. For example, plan to take a walk or exercise.  Try a relaxation exercise to calm you down and decrease your stress. Remember, you may be tense and nervous for the first 2  weeks after you quit, but this will pass.  Find new activities to keep your hands busy. Play with a pen, coin, or rubber band. Doodle or draw things on paper.  Brush your teeth right after eating. This will help cut down on the craving for the taste of tobacco after meals. You can also try mouthwash.   Use oral substitutes in place of cigarettes. Try using lemon drops, carrots, cinnamon sticks, or chewing gum. Keep them handy so they are available when you have the urge to smoke.  When you have the urge to smoke, try deep breathing.  Designate your home as a nonsmoking area.  If you are a heavy smoker, ask  your health care provider about a prescription for nicotine chewing gum. It can ease your withdrawal from nicotine.  Reward yourself. Set aside the cigarette money you save and buy yourself something nice.  Look for support from others. Join a support group or smoking cessation program. Ask someone at home or at work to help you with your plan to quit smoking.  Always ask yourself, "Do I need this cigarette or is this just a reflex?" Tell yourself, "Today, I choose not to smoke," or "I do not want to smoke." You are reminding yourself of your decision to quit.  Do not replace cigarette smoking with electronic cigarettes (commonly called e-cigarettes). The safety of e-cigarettes is unknown, and some may contain harmful chemicals.  If you relapse, do not give up! Plan ahead and think about what you will do the next time you get the urge to smoke. HOW WILL I FEEL WHEN I QUIT SMOKING? You may have symptoms of withdrawal because your body is used to nicotine (the addictive substance in cigarettes). You may crave cigarettes, be irritable, feel very hungry, cough often, get headaches, or have difficulty concentrating. The withdrawal symptoms are only temporary. They are strongest when you first quit but will go away within 10-14 days. When withdrawal symptoms occur, stay in control. Think about  your reasons for quitting. Remind yourself that these are signs that your body is healing and getting used to being without cigarettes. Remember that withdrawal symptoms are easier to treat than the major diseases that smoking can cause.  Even after the withdrawal is over, expect periodic urges to smoke. However, these cravings are generally short lived and will go away whether you smoke or not. Do not smoke! WHAT RESOURCES ARE AVAILABLE TO HELP ME QUIT SMOKING? Your health care provider can direct you to community resources or hospitals for support, which may include:  Group support.  Education.  Hypnosis.  Therapy. Document Released: 04/10/2004 Document Revised: 11/27/2013 Document Reviewed: 12/29/2012 [facility] Patient Information 2015 [place], [place]. This information is not intended to replace advice given to you by your health care provider. Make sure you discuss any questions you have with your health care provider.    Emergency Department Resource Guide 1) Find a Doctor and Pay Out of Pocket Although you won't have to find out who is covered by your insurance plan, it is a good idea to ask around and get recommendations. You will then need to call the office and see if the doctor you have chosen will accept you as a new patient and what types of options they offer for patients who are self-pay. Some doctors offer discounts or will set up payment plans for their patients who do not have insurance, but you will need to ask so you aren't surprised when you get to your appointment.  2) Contact Your Local Health Department Not all health departments have doctors that can see patients for sick visits, but many do, so it is worth a call to see if yours does. If you don't know where your local health department is, you can check in your phone book. The CDC also has a tool to help you locate your state's health department, and many state websites also have listings of all of their local health  departments.  3) Find a Walk-in Clinic If your illness is not likely to be very severe or complicated, you may want to try a walk in clinic. These are popping up all over the country in pharmacies,  drugstores, and shopping centers. They're usually staffed by nurse practitioners or physician assistants that have been trained to treat common illnesses and complaints. They're usually fairly quick and inexpensive. However, if you have serious medical issues or chronic medical problems, these are probably not your best option.  No Primary Care Doctor: - Call Health Connect at  [phone] - they can help you locate a primary care doctor that  accepts your insurance, provides certain services, etc. - Physician Referral Service- [phone]  Chronic Pain Problems: Organization         Address  Phone   Notes  [facility]  [phone]) [phone] Patients need to be referred by their primary care doctor.   Medication Assistance: Organization         Address  Phone   Notes  [facility] [facility] [address] [place], [place] [address] [phone] --Must be a resident of [place] -- Must have NO insurance coverage whatsoever (no Medicaid/ Medicare, etc.) -- The pt. MUST have a primary care doctor that directs their care regularly and follows them in the community   [facility]  [phone]   [employer]  [phone]) [phone]    Agencies that provide inexpensive medical care: Organization         Address  Phone   Notes  [facility]  [phone]   [facility]    [phone]   [facility] [address] [place], [place] [address] [phone]) [phone]   [facility] [address], [place] [phone]   [facility]    [phone]   [facility]    [phone](336) [phone]   [facility]  [address], [place] Phone:  [phone](336)  [phone], Fax:  [phone](336) [phone] Hours of Operation:  9 am - 6 pm, M-F.  Also accepts Medicaid/Medicare and self-pay.  [facility]  [address], [place] Phone: [phone](336) [phone], Fax: [phone]) [phone]. Hours of Operation:  8:30 am - 5:30 pm, M-F.  Also accepts Medicaid and self-pay.  [facility] [place] [address], [place] Phone: [phone]) [phone]   [facility] [address], [place], [place] [phone], Ext. [phone] Mondays & Thursdays: 7-9 AM.  First 15 patients are seen on a first come, first serve basis.    Medicaid-accepting [facility] [place] Providers:  Organization         Address  Phone   Notes  [facility] [address], [place] [phone] Also accepts self-pay patients.  [facility] [address], [place]  [phone]   [facility] [address], [place] [phone]   [facility] [address] [place] [address], [place] [phone]) [phone]   Renaye RakersVeita Bland [address], [place]   [phone] Only accepts [place] patients after they have their name applied to their card.   Self-Pay (no insurance) in [facility]:  Organization         Address  Phone   Notes  Sickle Cell Patients, [facility] [address], [place] [phone]) [phone]   [facility] [address], [place] [phone]   [facility] [place]  [address], [place] [phone]   [facility]/Dr. Julio Sicks  [address] [place] or [address], [place] [phone] Phone number for both [place] and [place] locations is the same.  [facility] [address] [place] [phone]   [facility] [address], [place] or [address] [phone]   [facility] [address], [place] [phone], phone; [phone], fax Sees patients 1st and 3rd Saturday of every month.  Must not qualify for public or private insurance (i.e. Medicaid, Medicare, [place] Health Choice, Veterans' Benefits)  Household income should be no more than 200% of the poverty level The clinic cannot treat you if you are pregnant or think you are pregnant  Sexually transmitted diseases are not treated at the clinic.    Dental Care: Organization         Address  Phone  Notes  [facility] Department of [facility] [address] [place], [place] [phone] Accepts children up to age 35 who are enrolled in [place] or [place] Health Choice; pregnant women with a Medicaid card; and children who have applied for Medicaid or [place] Health Choice, but were declined, whose parents can pay a reduced fee at time of service.  [facility] Department of [facility]  [address], [place] [phone] Accepts children up to age 43 who are enrolled in [place] or [place] Health Choice; pregnant women with a Medicaid card; and children who have applied for Medicaid or [place] Health Choice, but were declined, whose parents can pay a reduced fee at time of service.  [facility]  [address] [place], [place] [phone] Patients are seen by appointment only. Walk-ins are not accepted. [facility] will see patients 55 years of age and older. Monday - Tuesday (8am-5pm) Most Wednesdays (8:30-5pm) $30 per visit, cash only  [facility]  [address], [facility] [phone] Patients are seen by appointment only. Walk-ins are not accepted. [facility] will see patients 19 years of age and older. One Wednesday Evening (Monthly: Volunteer Based).  $30 per visit, cash only  [employer]  [phone] for adults;  Children under age 29, call Graduate Pediatric Dentistry at [phone]. Children aged 9-14, please call [phone] to request a pediatric application.  Dental services are provided in all areas of dental care including fillings, crowns and bridges, complete and partial dentures, implants, gum treatment, root canals, and extractions. Preventive care is also provided. Treatment is provided to both adults and children. Patients are selected via a lottery and there is often a waiting list.   [facility] [address] [place]  [phone] [email or web address]   [facility] [address] [place], [place] [phone], Ext. [phone] Second and Fourth Thursday of each month, opens at 6:30 AM; Clinic ends at 9 AM.  Patients are seen on a first-come first-served basis, and a limited number are seen during each clinic.   [facility]  [address] [place], [place] [phone]   Eligibility Requirements You must have lived in [place], [place], or [place] counties for at least the last three months.   You cannot be eligible for state or federal sponsored [place], including [employer], [place], or [employer].   You generally cannot be eligible  for healthcare insurance through your employer.    How to apply: Eligibility screenings are held every Tuesday and Wednesday afternoon from 1:00 pm until 4:00 pm. You do not need an appointment for the interview!  [facility] [address] [place], [place] [phone]   [facility] Health Department  [phone]   [facility] Health Department  [phone]   [facility] Health Department  [phone]    Behavioral Health Resources in the Community: Intensive Outpatient Programs Organization         Address  Phone  Notes  [facility] [address] [place], [place] [phone]   [facility] [address], [place], [place] [phone]   [facility] [address], [place], [place]  [phone]   [facility] [address],  [place], [place] [phone] or [phone]   Substance Abuse Resources Organization         Address  Phone  Notes  [facility]  [phone]   [facility]  [phone]   [facility]  [phone]   Floydene Flock  [phone]   Residential & Outpatient Substance Abuse Program  [phone]   Psychological Services Organization         Address  Phone  Notes  [facility]  [phone]   [facility]  [phone]   [facility] Mental Health [address] [place] [phone] or [phone]    Mobile Crisis Teams Organization         Address  Phone  Notes  [facility], Mobile Crisis Care Unit  [phone]   [facility]  [address] [address] [place], [place] [phone]   Doristine Locks [address] [place] [phone]    Self-Help/Support Groups Organization         Address  Phone             Notes  [facility] - variety of support groups  [phone]- [phone] Call for more information  Narcotics Anonymous (NA), [facility] [address], [employer] [place]  2 meetings at this location   Statistician         Address  Phone  Notes  [facility] [address],    [place]  [phone]   [facility]  [address], [place] [address], [place], [place] [phone]   [facility] [address] [place], [place] [phone] Admissions: 8am-3pm M-F  [facility] [address]    [place], [place] [phone]   [facility] [address] [place], [place], [place] [phone]   [facility] [address],  [place], [place] [phone]   [facility] Intensive  Outpatient [address], [place], [place] [phone]   [facility] [address] [place]  [place], [place] [phone] or [phone]   [facility] [address], [place], [place] [phone] Accepts Medicaid  [facility] [address]  [place] [phone] Substance Abuse/Addiction Treatment   [facility]         Address  Phone  Notes  [facility]  [phone]   Angie Fava, PhD [address] [place], [place]   [phone] or [phone]   [facility]   [address] [place] [address] [place],  [place] [phone]   [facility] [address], [place], [place] [phone] Insurance/Medicaid/sponsorship through [employer] [facility] [address]                                    [place], [place] [phone] Therapy/tele-psych/case  [facility] [address]   [place], [place] [phone]    Dr. Lolly Mustache  [phone]   [facility]  [employer] [facility] Dept. 1) [address]  2) [address], [place] 3)  [address], [place] [phone]  [phone]   [facility] Child Abuse Hotline [phone] or [phone] (After Hours)

## 2014-07-21 ENCOUNTER — Emergency Department (HOSPITAL_COMMUNITY): Payer: Self-pay

## 2014-07-21 ENCOUNTER — Encounter (HOSPITAL_COMMUNITY): Payer: Self-pay | Admitting: Emergency Medicine

## 2014-07-21 ENCOUNTER — Inpatient Hospital Stay (HOSPITAL_COMMUNITY)
Admission: EM | Admit: 2014-07-21 | Discharge: 2014-07-23 | DRG: 441 | Disposition: A | Discharge status: Home / Self Care | Payer: Self-pay | Attending: General Surgery | Admitting: General Surgery

## 2014-07-21 DIAGNOSIS — Y92009 Unspecified place in unspecified non-institutional (private) residence as the place of occurrence of the external cause: Secondary | ICD-10-CM

## 2014-07-21 DIAGNOSIS — S31119A Laceration without foreign body of abdominal wall, unspecified quadrant without penetration into peritoneal cavity, initial encounter: Secondary | ICD-10-CM | POA: Diagnosis present

## 2014-07-21 DIAGNOSIS — K661 Hemoperitoneum: Secondary | ICD-10-CM | POA: Diagnosis present

## 2014-07-21 DIAGNOSIS — Z23 Encounter for immunization: Secondary | ICD-10-CM

## 2014-07-21 DIAGNOSIS — S36113A Laceration of liver, unspecified degree, initial encounter: Principal | ICD-10-CM | POA: Diagnosis present

## 2014-07-21 DIAGNOSIS — S31600A Unspecified open wound of abdominal wall, right upper quadrant with penetration into peritoneal cavity, initial encounter: Secondary | ICD-10-CM | POA: Diagnosis present

## 2014-07-21 DIAGNOSIS — Z885 Allergy status to narcotic agent status: Secondary | ICD-10-CM

## 2014-07-21 DIAGNOSIS — W458XXA Other foreign body or object entering through skin, initial encounter: Secondary | ICD-10-CM | POA: Diagnosis present

## 2014-07-21 DIAGNOSIS — R071 Chest pain on breathing: Secondary | ICD-10-CM | POA: Diagnosis present

## 2014-07-21 DIAGNOSIS — T148XXA Other injury of unspecified body region, initial encounter: Secondary | ICD-10-CM

## 2014-07-21 DIAGNOSIS — T1490XA Injury, unspecified, initial encounter: Secondary | ICD-10-CM

## 2014-07-21 DIAGNOSIS — F419 Anxiety disorder, unspecified: Secondary | ICD-10-CM | POA: Diagnosis present

## 2014-07-21 DIAGNOSIS — E876 Hypokalemia: Secondary | ICD-10-CM | POA: Diagnosis present

## 2014-07-21 DIAGNOSIS — Z720 Tobacco use: Secondary | ICD-10-CM

## 2014-07-21 HISTORY — DX: Anxiety disorder, unspecified: F41.9

## 2014-07-21 LAB — COMPREHENSIVE METABOLIC PANEL
ALT: 48 U/L (ref 0–53)
AST: 49 U/L — AB (ref 0–37)
Albumin: 4.1 g/dL (ref 3.5–5.2)
Alkaline Phosphatase: 51 U/L (ref 39–117)
Anion gap: 19 — ABNORMAL HIGH (ref 5–15)
BUN: 8 mg/dL (ref 6–23)
CALCIUM: 9.1 mg/dL (ref 8.4–10.5)
CO2: 16 mmol/L — ABNORMAL LOW (ref 19–32)
Chloride: 107 mEq/L (ref 96–112)
Creatinine, Ser: 0.89 mg/dL (ref 0.50–1.35)
GFR calc non Af Amer: >90 mL/min (ref >90)
Glucose, Bld: 93 mg/dL (ref 70–99)
Potassium: 3.2 mmol/L — ABNORMAL LOW (ref 3.5–5.1)
SODIUM: 142 mmol/L (ref 135–145)
Total Bilirubin: 0.4 mg/dL (ref 0.3–1.2)
Total Protein: 7 g/dL (ref 6.0–8.3)

## 2014-07-21 LAB — I-STAT CHEM 8, ED
BUN: 8 mg/dL (ref 6–23)
Calcium, Ion: 1.03 mmol/L — ABNORMAL LOW (ref 1.12–1.23)
Chloride: 107 mEq/L (ref 96–112)
Creatinine, Ser: 1 mg/dL (ref 0.50–1.35)
Glucose, Bld: 100 mg/dL — ABNORMAL HIGH (ref 70–99)
HEMATOCRIT: 49 % (ref 39.0–52.0)
HEMOGLOBIN: 16.7 g/dL (ref 13.0–17.0)
POTASSIUM: 3.3 mmol/L — AB (ref 3.5–5.1)
SODIUM: 143 mmol/L (ref 135–145)
TCO2: 15 mmol/L (ref 0–100)

## 2014-07-21 LAB — CBC WITH DIFFERENTIAL/PLATELET
BASOS ABS: 0 10*3/uL (ref 0.0–0.1)
Basophils Relative: 0 % (ref 0–1)
EOS PCT: 2 % (ref 0–5)
Eosinophils Absolute: 0.2 10*3/uL (ref 0.0–0.7)
HCT: 43.7 % (ref 39.0–52.0)
Hemoglobin: 15.2 g/dL (ref 13.0–17.0)
LYMPHS ABS: 3 10*3/uL (ref 0.7–4.0)
LYMPHS PCT: 39 % (ref 12–46)
MCH: 32.7 pg (ref 26.0–34.0)
MCHC: 34.8 g/dL (ref 30.0–36.0)
MCV: 94 fL (ref 78.0–100.0)
MONO ABS: 0.4 10*3/uL (ref 0.1–1.0)
Monocytes Relative: 5 % (ref 3–12)
NEUTROS ABS: 4.1 10*3/uL (ref 1.7–7.7)
NEUTROS PCT: 54 % (ref 43–77)
PLATELETS: 281 10*3/uL (ref 150–400)
RBC: 4.65 MIL/uL (ref 4.22–5.81)
RDW: 13.4 % (ref 11.5–15.5)
WBC: 7.7 10*3/uL (ref 4.0–10.5)

## 2014-07-21 LAB — TYPE AND SCREEN
ABO/RH(D): A POS
Antibody Screen: NEGATIVE

## 2014-07-21 LAB — PREPARE FRESH FROZEN PLASMA
UNIT DIVISION: 0
Unit division: 0

## 2014-07-21 LAB — I-STAT CG4 LACTIC ACID, ED: Lactic Acid, Venous: 3.16 mmol/L — ABNORMAL HIGH (ref 0.5–2.2)

## 2014-07-21 LAB — SAMPLE TO BLOOD BANK

## 2014-07-21 LAB — ETHANOL: ALCOHOL ETHYL (B): 142 mg/dL — AB (ref 0–9)

## 2014-07-21 LAB — PROTIME-INR
INR: 1.14 (ref 0.00–1.49)
PROTHROMBIN TIME: 14.8 s (ref 11.6–15.2)

## 2014-07-21 MED ORDER — MORPHINE SULFATE 4 MG/ML IJ SOLN
INTRAMUSCULAR | Status: AC
  Administered 2014-07-21: 3 mg
  Filled 2014-07-21: qty 1

## 2014-07-21 MED ORDER — POTASSIUM CHLORIDE IN NACL 20-0.9 MEQ/L-% IV SOLN
INTRAVENOUS | Status: DC
  Administered 2014-07-21 – 2014-07-22 (×2): via INTRAVENOUS
  Filled 2014-07-21 (×4): qty 1000

## 2014-07-21 MED ORDER — PANTOPRAZOLE SODIUM 20 MG PO TBEC
20.0000 mg | DELAYED_RELEASE_TABLET | Freq: Every day | ORAL | Status: DC
  Administered 2014-07-22 – 2014-07-23 (×2): 20 mg via ORAL
  Filled 2014-07-21 (×2): qty 1

## 2014-07-21 MED ORDER — LORAZEPAM 1 MG PO TABS
1.0000 mg | ORAL_TABLET | Freq: Three times a day (TID) | ORAL | Status: DC | PRN
  Administered 2014-07-21 – 2014-07-22 (×2): 1 mg via ORAL
  Filled 2014-07-21 (×2): qty 1

## 2014-07-21 MED ORDER — SODIUM CHLORIDE 0.9 % IV BOLUS (SEPSIS)
1000.0000 mL | Freq: Once | INTRAVENOUS | Status: AC
  Administered 2014-07-21: 1000 mL via INTRAVENOUS

## 2014-07-21 MED ORDER — DIPHENHYDRAMINE HCL 50 MG/ML IJ SOLN
12.5000 mg | Freq: Three times a day (TID) | INTRAMUSCULAR | Status: DC | PRN
  Administered 2014-07-22: 12.5 mg via INTRAVENOUS
  Filled 2014-07-21: qty 1

## 2014-07-21 MED ORDER — ONDANSETRON HCL 4 MG/2ML IJ SOLN
4.0000 mg | Freq: Four times a day (QID) | INTRAMUSCULAR | Status: DC | PRN
Start: 2014-07-21 — End: 2014-07-23
  Administered 2014-07-22: 4 mg via INTRAVENOUS
  Filled 2014-07-21: qty 2

## 2014-07-21 MED ORDER — CEFAZOLIN SODIUM-DEXTROSE 2-3 GM-% IV SOLR
2.0000 g | Freq: Three times a day (TID) | INTRAVENOUS | Status: DC
  Administered 2014-07-21 – 2014-07-22 (×2): 2 g via INTRAVENOUS
  Filled 2014-07-21 (×2): qty 50

## 2014-07-21 MED ORDER — IOHEXOL 300 MG/ML  SOLN
100.0000 mL | Freq: Once | INTRAMUSCULAR | Status: AC | PRN
Start: 2014-07-21 — End: 2014-07-21
  Administered 2014-07-21: 100 mL via INTRAVENOUS

## 2014-07-21 MED ORDER — TETANUS-DIPHTHERIA TOXOIDS TD 5-2 LFU IM INJ: 0.5000 mL | INJECTION | Freq: Once | INTRAMUSCULAR | Status: DC

## 2014-07-21 MED ORDER — TETANUS-DIPHTH-ACELL PERTUSSIS 5-2.5-18.5 LF-MCG/0.5 IM SUSP
0.5000 mL | Freq: Once | INTRAMUSCULAR | Status: AC
  Administered 2014-07-21: 0.5 mL via INTRAMUSCULAR

## 2014-07-21 MED ORDER — MORPHINE SULFATE 2 MG/ML IJ SOLN
1.0000 mg | INTRAMUSCULAR | Status: DC | PRN
  Administered 2014-07-21 – 2014-07-22 (×2): 3 mg via INTRAVENOUS
  Filled 2014-07-21 (×2): qty 2

## 2014-07-21 MED ORDER — FENTANYL CITRATE 0.05 MG/ML IJ SOLN: 50.0000 ug | Freq: Once | INTRAMUSCULAR | Status: DC

## 2014-07-21 MED ORDER — HYDROMORPHONE HCL 1 MG/ML IJ SOLN
1.0000 mg | Freq: Once | INTRAMUSCULAR | Status: AC
Start: 2014-07-21 — End: 2014-07-21
  Administered 2014-07-21: 1 mg via INTRAVENOUS
  Filled 2014-07-21: qty 1

## 2014-07-21 MED ORDER — TETANUS-DIPHTH-ACELL PERTUSSIS 5-2.5-18.5 LF-MCG/0.5 IM SUSP
INTRAMUSCULAR | Status: AC
  Filled 2014-07-21: qty 0.5

## 2014-07-21 MED ORDER — CEFAZOLIN SODIUM-DEXTROSE 2-3 GM-% IV SOLR
INTRAVENOUS | Status: AC
  Filled 2014-07-21: qty 50

## 2014-07-21 MED ORDER — ONDANSETRON HCL 4 MG PO TABS: 4.0000 mg | ORAL_TABLET | Freq: Four times a day (QID) | ORAL | Status: DC | PRN

## 2014-07-21 NOTE — ED Notes (Signed)
Dr Radford PaxBeaton given a copy of lactic acid 3.16

## 2014-07-21 NOTE — ED Notes (Signed)
Patient returned from CT and placed on cardiac monitor. 

## 2014-07-21 NOTE — ED Notes (Signed)
Dr Wilson at bedside.
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
## 2014-07-21 NOTE — ED Notes (Signed)
Patient taken to CT.
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
## 2014-07-21 NOTE — ED Notes (Signed)
No metal seen in chest on portable chest xray.
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
## 2014-07-21 NOTE — ED Notes (Signed)
[employer] and detective in with patient.

## 2014-07-22 DIAGNOSIS — S31119A Laceration without foreign body of abdominal wall, unspecified quadrant without penetration into peritoneal cavity, initial encounter: Secondary | ICD-10-CM | POA: Diagnosis present

## 2014-07-22 LAB — COMPREHENSIVE METABOLIC PANEL
ALBUMIN: 3.8 g/dL (ref 3.5–5.2)
ALT: 50 U/L (ref 0–53)
AST: 50 U/L — ABNORMAL HIGH (ref 0–37)
Alkaline Phosphatase: 48 U/L (ref 39–117)
Anion gap: 9 (ref 5–15)
BUN: 7 mg/dL (ref 6–23)
CALCIUM: 8.6 mg/dL (ref 8.4–10.5)
CHLORIDE: 107 meq/L (ref 96–112)
CO2: 24 mmol/L (ref 19–32)
Creatinine, Ser: 0.76 mg/dL (ref 0.50–1.35)
GFR calc Af Amer: >90 mL/min (ref >90)
GFR calc non Af Amer: >90 mL/min (ref >90)
Glucose, Bld: 111 mg/dL — ABNORMAL HIGH (ref 70–99)
Potassium: 3.9 mmol/L (ref 3.5–5.1)
Sodium: 140 mmol/L (ref 135–145)
Total Bilirubin: 0.7 mg/dL (ref 0.3–1.2)
Total Protein: 6.8 g/dL (ref 6.0–8.3)

## 2014-07-22 LAB — CBC
HCT: 40.5 % (ref 39.0–52.0)
HCT: 37.8 % — ABNORMAL LOW (ref 39.0–52.0)
HEMATOCRIT: 38.6 % — AB (ref 39.0–52.0)
HEMOGLOBIN: 13.5 g/dL (ref 13.0–17.0)
HEMOGLOBIN: 13.1 g/dL (ref 13.0–17.0)
HEMOGLOBIN: 12.5 g/dL — AB (ref 13.0–17.0)
MCH: 30.9 pg (ref 26.0–34.0)
MCH: 31.7 pg (ref 26.0–34.0)
MCH: 30.7 pg (ref 26.0–34.0)
MCHC: 33.3 g/dL (ref 30.0–36.0)
MCHC: 33.9 g/dL (ref 30.0–36.0)
MCHC: 33.1 g/dL (ref 30.0–36.0)
MCV: 92.7 fL (ref 78.0–100.0)
MCV: 93.5 fL (ref 78.0–100.0)
MCV: 92.9 fL (ref 78.0–100.0)
PLATELETS: 244 10*3/uL (ref 150–400)
Platelets: 209 10*3/uL (ref 150–400)
Platelets: 204 10*3/uL (ref 150–400)
RBC: 4.37 MIL/uL (ref 4.22–5.81)
RBC: 4.13 MIL/uL — ABNORMAL LOW (ref 4.22–5.81)
RBC: 4.07 MIL/uL — ABNORMAL LOW (ref 4.22–5.81)
RDW: 13.5 % (ref 11.5–15.5)
RDW: 13.6 % (ref 11.5–15.5)
RDW: 13.2 % (ref 11.5–15.5)
WBC: 14.4 10*3/uL — AB (ref 4.0–10.5)
WBC: 10.6 10*3/uL — ABNORMAL HIGH (ref 4.0–10.5)
WBC: 8.5 10*3/uL (ref 4.0–10.5)

## 2014-07-22 LAB — PROTIME-INR
INR: 1.11 (ref 0.00–1.49)
PROTHROMBIN TIME: 14.5 s (ref 11.6–15.2)

## 2014-07-22 LAB — APTT: aPTT: 24 seconds (ref 24–37)

## 2014-07-22 LAB — MRSA PCR SCREENING: MRSA BY PCR: NEGATIVE

## 2014-07-22 LAB — ABO/RH: ABO/RH(D): A POS

## 2014-07-22 MED ORDER — FOLIC ACID 1 MG PO TABS
1.0000 mg | ORAL_TABLET | Freq: Every day | ORAL | Status: DC
  Administered 2014-07-22 – 2014-07-23 (×2): 1 mg via ORAL
  Filled 2014-07-22 (×2): qty 1

## 2014-07-22 MED ORDER — DOCUSATE SODIUM 100 MG PO CAPS
100.0000 mg | ORAL_CAPSULE | Freq: Two times a day (BID) | ORAL | Status: DC
  Administered 2014-07-22 – 2014-07-23 (×3): 100 mg via ORAL
  Filled 2014-07-22 (×4): qty 1

## 2014-07-22 MED ORDER — HYDROMORPHONE HCL 1 MG/ML IJ SOLN
INTRAMUSCULAR | Status: AC
  Filled 2014-07-22: qty 2

## 2014-07-22 MED ORDER — LORAZEPAM 2 MG/ML IJ SOLN
INTRAMUSCULAR | Status: AC
  Administered 2014-07-22: 2 mg via INTRAVENOUS
  Filled 2014-07-22: qty 1

## 2014-07-22 MED ORDER — HYDROMORPHONE HCL 1 MG/ML IJ SOLN: 1.0000 mg | INTRAMUSCULAR | Status: DC | PRN

## 2014-07-22 MED ORDER — HYDROMORPHONE HCL 1 MG/ML IJ SOLN
1.0000 mg | INTRAMUSCULAR | Status: DC | PRN
  Administered 2014-07-22: 2 mg via INTRAVENOUS
  Administered 2014-07-22 (×2): 1 mg via INTRAVENOUS
  Administered 2014-07-22: 2 mg via INTRAVENOUS
  Filled 2014-07-22: qty 2
  Filled 2014-07-22 (×2): qty 1

## 2014-07-22 MED ORDER — VITAMIN B-1 100 MG PO TABS
100.0000 mg | ORAL_TABLET | Freq: Every day | ORAL | Status: DC
  Administered 2014-07-22 – 2014-07-23 (×2): 100 mg via ORAL
  Filled 2014-07-22 (×2): qty 1

## 2014-07-22 MED ORDER — OXYCODONE HCL 5 MG PO TABS
5.0000 mg | ORAL_TABLET | ORAL | Status: DC | PRN
  Administered 2014-07-22 – 2014-07-23 (×6): 15 mg via ORAL
  Filled 2014-07-22 (×6): qty 3

## 2014-07-22 MED ORDER — BACITRACIN ZINC 500 UNIT/GM EX OINT
TOPICAL_OINTMENT | Freq: Two times a day (BID) | CUTANEOUS | Status: DC
  Administered 2014-07-22 – 2014-07-23 (×3): via TOPICAL
  Filled 2014-07-22: qty 28.35

## 2014-07-22 MED ORDER — ADULT MULTIVITAMIN W/MINERALS CH
1.0000 | ORAL_TABLET | Freq: Every day | ORAL | Status: DC
Start: 2014-07-22 — End: 2014-07-23
  Administered 2014-07-22 – 2014-07-23 (×2): 1 via ORAL
  Filled 2014-07-22 (×2): qty 1

## 2014-07-22 MED ORDER — POLYETHYLENE GLYCOL 3350 17 G PO PACK
17.0000 g | PACK | Freq: Every day | ORAL | Status: DC
  Filled 2014-07-22 (×2): qty 1

## 2014-07-22 MED ORDER — LIDOCAINE-EPINEPHRINE 1 %-1:100000 IJ SOLN
INTRAMUSCULAR | Status: AC
  Filled 2014-07-22: qty 1

## 2014-07-22 MED ORDER — LORAZEPAM 2 MG/ML IJ SOLN
2.0000 mg | INTRAMUSCULAR | Status: DC | PRN
  Administered 2014-07-22 – 2014-07-23 (×4): 2 mg via INTRAVENOUS
  Filled 2014-07-22 (×3): qty 1
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
## 2014-07-23 LAB — CBC
HCT: 36.4 % — ABNORMAL LOW (ref 39.0–52.0)
HCT: 38.2 % — ABNORMAL LOW (ref 39.0–52.0)
HEMOGLOBIN: 12.9 g/dL — AB (ref 13.0–17.0)
Hemoglobin: 12.1 g/dL — ABNORMAL LOW (ref 13.0–17.0)
MCH: 30.9 pg (ref 26.0–34.0)
MCH: 32 pg (ref 26.0–34.0)
MCHC: 33.2 g/dL (ref 30.0–36.0)
MCHC: 33.8 g/dL (ref 30.0–36.0)
MCV: 92.9 fL (ref 78.0–100.0)
MCV: 94.8 fL (ref 78.0–100.0)
PLATELETS: 201 10*3/uL (ref 150–400)
PLATELETS: 235 10*3/uL (ref 150–400)
RBC: 3.92 MIL/uL — ABNORMAL LOW (ref 4.22–5.81)
RBC: 4.03 MIL/uL — AB (ref 4.22–5.81)
RDW: 13.1 % (ref 11.5–15.5)
RDW: 13 % (ref 11.5–15.5)
WBC: 8.1 10*3/uL (ref 4.0–10.5)
WBC: 8.3 10*3/uL (ref 4.0–10.5)

## 2014-07-23 MED ORDER — SODIUM CHLORIDE 0.9 % IJ SOLN: 3.0000 mL | INTRAMUSCULAR | Status: DC | PRN

## 2014-07-23 MED ORDER — BACITRACIN-NEOMYCIN-POLYMYXIN OINTMENT TUBE
TOPICAL_OINTMENT | Freq: Every day | CUTANEOUS | Status: DC
  Administered 2014-07-23: 10:00:00 via TOPICAL
  Filled 2014-07-23: qty 15

## 2014-07-23 MED ORDER — OXYCODONE-ACETAMINOPHEN 7.5-325 MG PO TABS: 1.0000 | ORAL_TABLET | ORAL | Status: DC | PRN

## 2014-07-23 MED ORDER — LORAZEPAM 1 MG PO TABS: 1.0000 mg | ORAL_TABLET | Freq: Three times a day (TID) | ORAL | Status: DC | PRN
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
## 2014-07-23 NOTE — Discharge Instructions (Signed)
No driving while taking oxycodone.  No running, jumping, ball or contact sports, bikes, skateboards, motorcycles, etc for 3 months.  Wash wounds daily in shower with soap and water. Do not soak. Apply antibiotic ointment (e.g. Neosporin) twice daily and as needed to keep moist. Cover with dry dressing.
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
## 2014-07-23 NOTE — Progress Notes (Signed)
Patient and wife given discharge and medication instructions.  No questions or concerns voiced.  Patient discharged home.

## 2014-07-23 NOTE — Progress Notes (Signed)
Pt enrolled in MATCH program for medication assistance.  Provided with letter, list of participating pharmacies and an explanation of the process. Pt/caregiver stated understanding.  Altair Stanko, RN BSN MHA CCM  Case Manager, Trauma Service/Unit 3M [phone]  
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
## 2014-07-24 ENCOUNTER — Encounter (HOSPITAL_COMMUNITY): Payer: Self-pay | Admitting: Emergency Medicine

## 2014-12-10 ENCOUNTER — Emergency Department (HOSPITAL_COMMUNITY)
Admission: EM | Admit: 2014-12-10 | Discharge: 2014-12-10 | Discharge status: Against Medical Advice | Payer: Self-pay | Attending: Emergency Medicine | Admitting: Emergency Medicine

## 2014-12-10 ENCOUNTER — Encounter (HOSPITAL_COMMUNITY): Payer: Self-pay | Admitting: Emergency Medicine

## 2014-12-10 DIAGNOSIS — Z720 Tobacco use: Secondary | ICD-10-CM | POA: Insufficient documentation

## 2014-12-10 DIAGNOSIS — R197 Diarrhea, unspecified: Secondary | ICD-10-CM | POA: Insufficient documentation

## 2014-12-10 DIAGNOSIS — J449 Chronic obstructive pulmonary disease, unspecified: Secondary | ICD-10-CM | POA: Insufficient documentation

## 2014-12-10 DIAGNOSIS — F419 Anxiety disorder, unspecified: Secondary | ICD-10-CM | POA: Insufficient documentation

## 2014-12-10 LAB — URINALYSIS, ROUTINE W REFLEX MICROSCOPIC
Bilirubin Urine: NEGATIVE
GLUCOSE, UA: NEGATIVE mg/dL
Ketones, ur: NEGATIVE mg/dL
LEUKOCYTES UA: NEGATIVE
Nitrite: NEGATIVE
PH: 5 (ref 5.0–8.0)
Protein, ur: NEGATIVE mg/dL
Specific Gravity, Urine: 1.007 (ref 1.005–1.030)
Urobilinogen, UA: 0.2 mg/dL (ref 0.0–1.0)

## 2014-12-10 LAB — URINE MICROSCOPIC-ADD ON

## 2014-12-10 NOTE — ED Notes (Signed)
Per triage, patient has left and is no longer here.

## 2014-12-10 NOTE — ED Notes (Signed)
Pt told by another patient that the wait was going to be long and place stickers on the desk and said that he was leaving. This Nurse explained to the patient/family that we were going to do his labs immediately and he was the next to go back for a room. Pt would not stop and left.

## 2014-12-10 NOTE — ED Notes (Signed)
Pt reports 5 episode of diarrhea this am denies blood in stool. Denies vomit but is positive for nausea. Reports anxiety is high right now and woke up this am "in a panic attack". Pt calm and in NAD at this time.

## 2016-01-22 ENCOUNTER — Emergency Department (HOSPITAL_COMMUNITY): Payer: Self-pay

## 2016-01-22 ENCOUNTER — Encounter (HOSPITAL_COMMUNITY): Payer: Self-pay

## 2016-01-22 ENCOUNTER — Emergency Department (HOSPITAL_COMMUNITY)
Admission: EM | Admit: 2016-01-22 | Discharge: 2016-01-22 | Disposition: A | Discharge status: Home / Self Care | Payer: Self-pay | Attending: Emergency Medicine | Admitting: Emergency Medicine

## 2016-01-22 DIAGNOSIS — R42 Dizziness and giddiness: Secondary | ICD-10-CM | POA: Insufficient documentation

## 2016-01-22 DIAGNOSIS — R079 Chest pain, unspecified: Secondary | ICD-10-CM | POA: Insufficient documentation

## 2016-01-22 DIAGNOSIS — J45909 Unspecified asthma, uncomplicated: Secondary | ICD-10-CM | POA: Insufficient documentation

## 2016-01-22 DIAGNOSIS — F17210 Nicotine dependence, cigarettes, uncomplicated: Secondary | ICD-10-CM | POA: Insufficient documentation

## 2016-01-22 DIAGNOSIS — J449 Chronic obstructive pulmonary disease, unspecified: Secondary | ICD-10-CM | POA: Insufficient documentation

## 2016-01-22 DIAGNOSIS — Z7982 Long term (current) use of aspirin: Secondary | ICD-10-CM | POA: Insufficient documentation

## 2016-01-22 DIAGNOSIS — Z79899 Other long term (current) drug therapy: Secondary | ICD-10-CM | POA: Insufficient documentation

## 2016-01-22 LAB — COMPREHENSIVE METABOLIC PANEL
ALK PHOS: 70 U/L (ref 38–126)
ALT: 39 U/L (ref 17–63)
AST: 43 U/L — AB (ref 15–41)
Albumin: 4.4 g/dL (ref 3.5–5.0)
Anion gap: 9 (ref 5–15)
BUN: 10 mg/dL (ref 6–20)
CALCIUM: 9.4 mg/dL (ref 8.9–10.3)
CHLORIDE: 103 mmol/L (ref 101–111)
CO2: 27 mmol/L (ref 22–32)
CREATININE: 0.72 mg/dL (ref 0.61–1.24)
Glucose, Bld: 99 mg/dL (ref 65–99)
Potassium: 3.4 mmol/L — ABNORMAL LOW (ref 3.5–5.1)
Sodium: 139 mmol/L (ref 135–145)
Total Bilirubin: 0.8 mg/dL (ref 0.3–1.2)
Total Protein: 8.2 g/dL — ABNORMAL HIGH (ref 6.5–8.1)

## 2016-01-22 LAB — CBC WITH DIFFERENTIAL/PLATELET
BASOS ABS: 0 10*3/uL (ref 0.0–0.1)
Basophils Relative: 0 %
EOS PCT: 2 %
Eosinophils Absolute: 0.2 10*3/uL (ref 0.0–0.7)
HCT: 44.7 % (ref 39.0–52.0)
HEMOGLOBIN: 16.3 g/dL (ref 13.0–17.0)
LYMPHS ABS: 2 10*3/uL (ref 0.7–4.0)
LYMPHS PCT: 26 %
MCH: 33.3 pg (ref 26.0–34.0)
MCHC: 36.5 g/dL — ABNORMAL HIGH (ref 30.0–36.0)
MCV: 91.2 fL (ref 78.0–100.0)
Monocytes Absolute: 0.7 10*3/uL (ref 0.1–1.0)
Monocytes Relative: 8 %
NEUTROS ABS: 5 10*3/uL (ref 1.7–7.7)
NEUTROS PCT: 64 %
PLATELETS: 275 10*3/uL (ref 150–400)
RBC: 4.9 MIL/uL (ref 4.22–5.81)
RDW: 12.8 % (ref 11.5–15.5)
WBC: 7.9 10*3/uL (ref 4.0–10.5)

## 2016-01-22 LAB — RAPID URINE DRUG SCREEN, HOSP PERFORMED
AMPHETAMINES: NOT DETECTED
BARBITURATES: NOT DETECTED
BENZODIAZEPINES: POSITIVE — AB
Cocaine: NOT DETECTED
Opiates: NOT DETECTED
Tetrahydrocannabinol: POSITIVE — AB

## 2016-01-22 LAB — I-STAT TROPONIN, ED: TROPONIN I, POC: 0 ng/mL (ref 0.00–0.08)

## 2016-01-22 MED ORDER — PANTOPRAZOLE SODIUM 40 MG PO TBEC
40.0000 mg | DELAYED_RELEASE_TABLET | Freq: Once | ORAL | Status: AC
  Administered 2016-01-22: 40 mg via ORAL
  Filled 2016-01-22: qty 1

## 2016-01-22 MED ORDER — POTASSIUM CHLORIDE CRYS ER 20 MEQ PO TBCR
40.0000 meq | EXTENDED_RELEASE_TABLET | Freq: Once | ORAL | Status: DC
  Filled 2016-01-22: qty 2

## 2016-01-22 MED ORDER — ASPIRIN 81 MG PO CHEW
324.0000 mg | CHEWABLE_TABLET | Freq: Once | ORAL | Status: AC
  Administered 2016-01-22: 324 mg via ORAL
  Filled 2016-01-22: qty 4

## 2016-01-22 MED ORDER — ALBUTEROL SULFATE HFA 108 (90 BASE) MCG/ACT IN AERS
2.0000 | INHALATION_SPRAY | RESPIRATORY_TRACT | Status: DC | PRN
  Administered 2016-01-22: 2 via RESPIRATORY_TRACT
  Filled 2016-01-22: qty 6.7

## 2016-01-22 MED ORDER — ALBUTEROL SULFATE HFA 108 (90 BASE) MCG/ACT IN AERS: 2.0000 | INHALATION_SPRAY | RESPIRATORY_TRACT | Status: AC | PRN

## 2016-01-22 MED ORDER — OMEPRAZOLE 20 MG PO CPDR: 20.0000 mg | DELAYED_RELEASE_CAPSULE | Freq: Every day | ORAL | Status: AC

## 2016-01-22 MED ORDER — AEROCHAMBER Z-STAT PLUS/MEDIUM MISC
1.0000 | Freq: Once | Status: AC
  Administered 2016-01-22: 1
  Filled 2016-01-22: qty 1
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
## 2016-01-22 NOTE — ED Notes (Signed)
Pt complains of chest pains for three days, he states it feels like a big knot in his chest.

## 2016-01-22 NOTE — Discharge Instructions (Signed)
1. Medications: albuterol, omeprazole, usual home medications 2. Treatment: rest, drink plenty of fluids,  3. Follow Up: Please followup with your primary doctor in 2-3 days for discussion of your diagnoses and further evaluation after today's visit; if you do not have a primary care doctor use the resource guide provided to find one; Please return to the ER for worsening symptoms, increasing SOB, CP that is associated with nausea, vomiting or syncope.     Nonspecific Chest Pain  Chest pain can be caused by many different conditions. There is always a chance that your pain could be related to something serious, such as a heart attack or a blood clot in your lungs. Chest pain can also be caused by conditions that are not life-threatening. If you have chest pain, it is very important to follow up with your health care provider. CAUSES  Chest pain can be caused by:  Heartburn.  Pneumonia or bronchitis.  Anxiety or stress.  Inflammation around your heart (pericarditis) or lung (pleuritis or pleurisy).  A blood clot in your lung.  A collapsed lung (pneumothorax). It can develop suddenly on its own (spontaneous pneumothorax) or from trauma to the chest.  Shingles infection (varicella-zoster virus).  Heart attack.  Damage to the bones, muscles, and cartilage that make up your chest wall. This can include:  Bruised bones due to injury.  Strained muscles or cartilage due to frequent or repeated coughing or overwork.  Fracture to one or more ribs.  Sore cartilage due to inflammation (costochondritis). RISK FACTORS  Risk factors for chest pain may include:  Activities that increase your risk for trauma or injury to your chest.  Respiratory infections or conditions that cause frequent coughing.  Medical conditions or overeating that can cause heartburn.  Heart disease or family history of heart disease.  Conditions or health behaviors that increase your risk of developing a blood  clot.  Having had chicken pox (varicella zoster). SIGNS AND SYMPTOMS Chest pain can feel like:  Burning or tingling on the surface of your chest or deep in your chest.  Crushing, pressure, aching, or squeezing pain.  Dull or sharp pain that is worse when you move, cough, or take a deep breath.  Pain that is also felt in your back, neck, shoulder, or arm, or pain that spreads to any of these areas. Your chest pain may come and go, or it may stay constant. DIAGNOSIS Lab tests or other studies may be needed to find the cause of your pain. Your health care provider may have you take a test called an ambulatory ECG (electrocardiogram). An ECG records your heartbeat patterns at the time the test is performed. You may also have other tests, such as:  Transthoracic echocardiogram (TTE). During echocardiography, sound waves are used to create a picture of all of the heart structures and to look at how blood flows through your heart.  Transesophageal echocardiogram (TEE).This is a more advanced imaging test that obtains images from inside your body. It allows your health care provider to see your heart in finer detail.  Cardiac monitoring. This allows your health care provider to monitor your heart rate and rhythm in real time.  Holter monitor. This is a portable device that records your heartbeat and can help to diagnose abnormal heartbeats. It allows your health care provider to track your heart activity for several days, if needed.  Stress tests. These can be done through exercise or by taking medicine that makes your heart beat more quickly.  Blood tests.  Imaging tests. TREATMENT  Your treatment depends on what is causing your chest pain. Treatment may include:  Medicines. These may include:  Acid blockers for heartburn.  Anti-inflammatory medicine.  Pain medicine for inflammatory conditions.  Antibiotic medicine, if an infection is present.  Medicines to dissolve blood  clots.  Medicines to treat coronary artery disease.  Supportive care for conditions that do not require medicines. This may include:  Resting.  Applying heat or cold packs to injured areas.  Limiting activities until pain decreases. HOME CARE INSTRUCTIONS  If you were prescribed an antibiotic medicine, finish it all even if you start to feel better.  Avoid any activities that bring on chest pain.  Do not use any tobacco products, including cigarettes, chewing tobacco, or electronic cigarettes. If you need help quitting, ask your health care provider.  Do not drink alcohol.  Take medicines only as directed by your health care provider.  Keep all follow-up visits as directed by your health care provider. This is important. This includes any further testing if your chest pain does not go away.  If heartburn is the cause for your chest pain, you may be told to keep your head raised (elevated) while sleeping. This reduces the chance that acid will go from your stomach into your esophagus.  Make lifestyle changes as directed by your health care provider. These may include:  Getting regular exercise. Ask your health care provider to suggest some activities that are safe for you.  Eating a heart-healthy diet. A registered dietitian can help you to learn healthy eating options.  Maintaining a healthy weight.  Managing diabetes, if necessary.  Reducing stress. SEEK MEDICAL CARE IF:  Your chest pain does not go away after treatment.  You have a rash with blisters on your chest.  You have a fever. SEEK IMMEDIATE MEDICAL CARE IF:   Your chest pain is worse.  You have an increasing cough, or you cough up blood.  You have severe abdominal pain.  You have severe weakness.  You faint.  You have chills.  You have sudden, unexplained chest discomfort.  You have sudden, unexplained discomfort in your arms, back, neck, or jaw.  You have shortness of breath at any  time.  You suddenly start to sweat, or your skin gets clammy.  You feel nauseous or you vomit.  You suddenly feel light-headed or dizzy.  Your heart begins to beat quickly, or it feels like it is skipping beats. These symptoms may represent a serious problem that is an emergency. Do not wait to see if the symptoms will go away. Get medical help right away. Call your local emergency services (911 in the [place] Do not drive yourself to the hospital.   This information is not intended to replace advice given to you by your health care provider. Make sure you discuss any questions you have with your health care provider.   Document Released: 04/22/2005 Document Revised: 08/03/2014 Document Reviewed: 02/16/2014 Elsevier Interactive Patient Education [employer]2016 [employer] Inc.

## 2017-03-10 IMAGING — CR DG CHEST 2V
2 series · 2 of 2 positions shown · non-contrast
Comparison: 07/12/2014

CLINICAL DATA: Cough and congestion with chest pain for 4 days

EXAM:
CHEST  2 VIEW

[w chest pa]
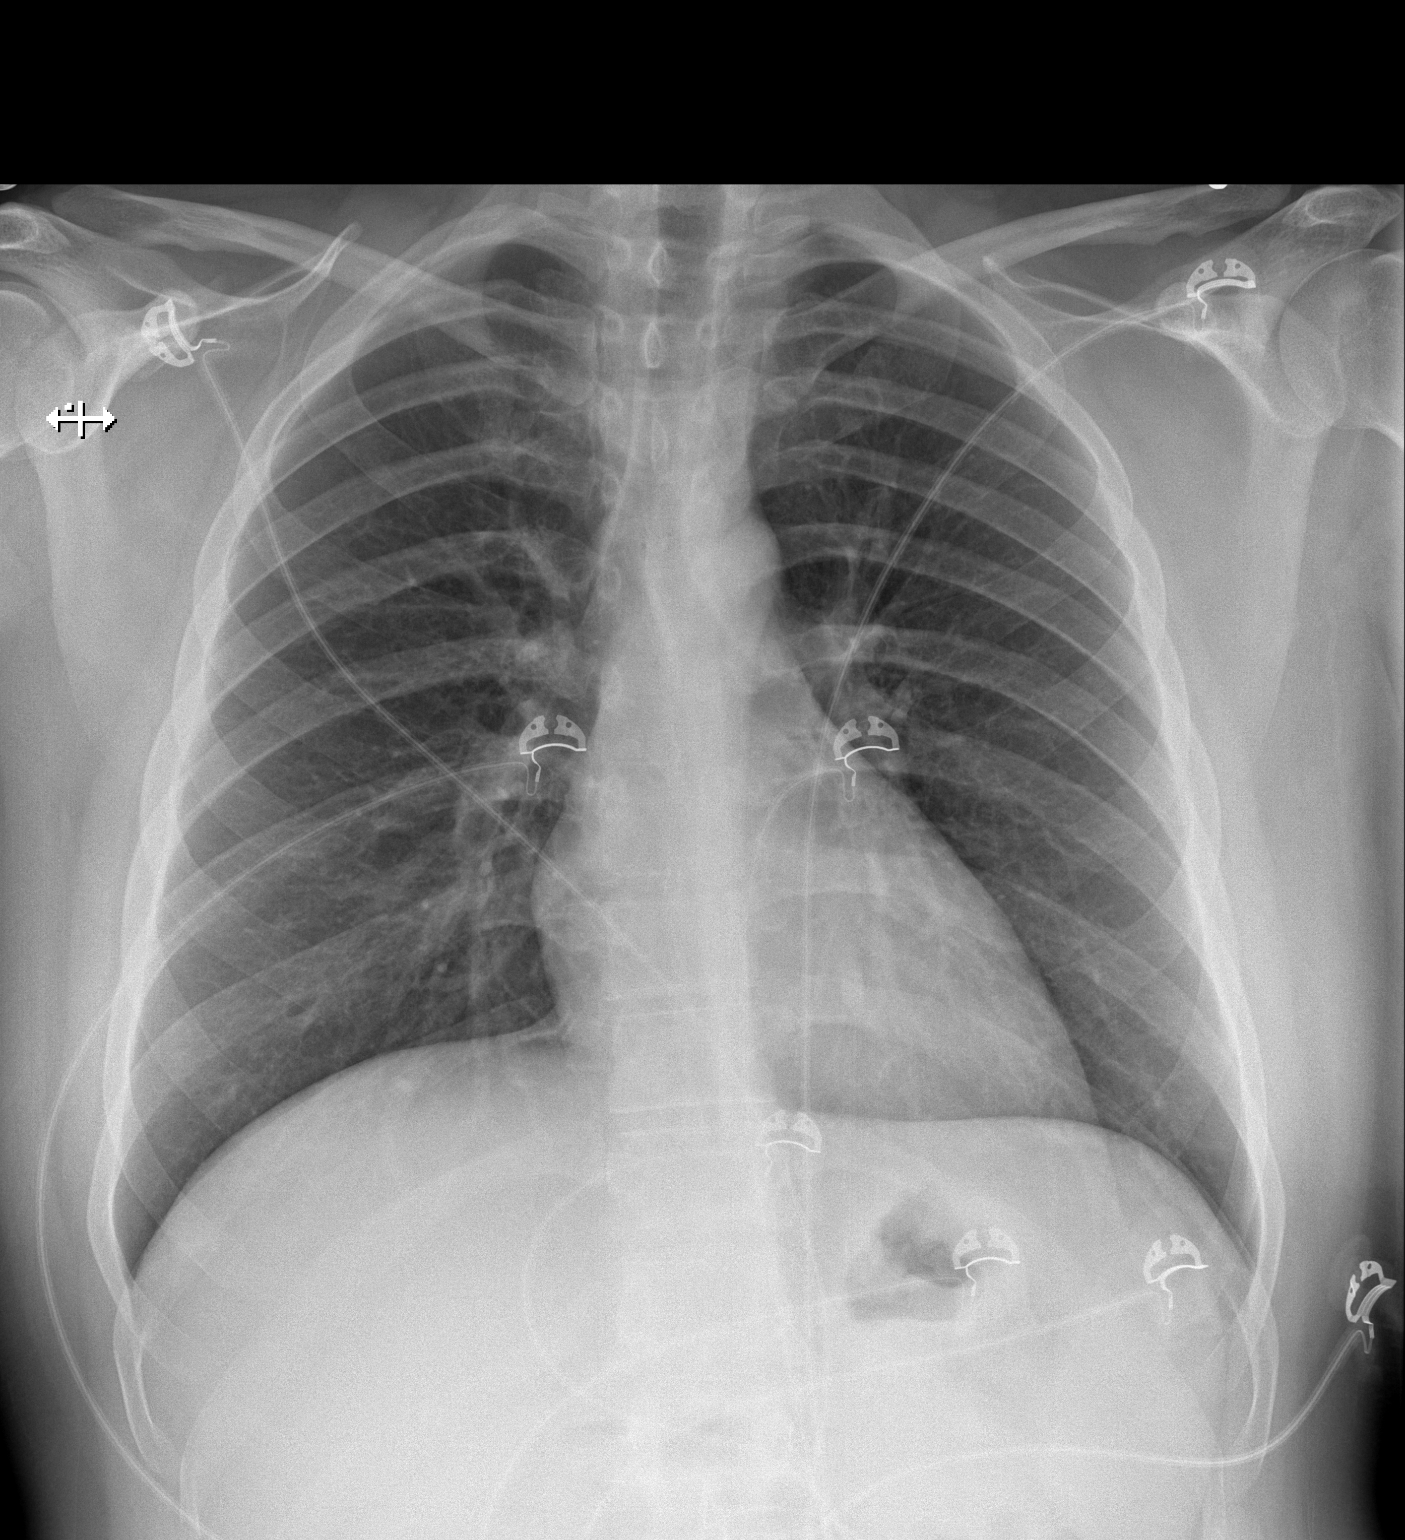

[w chest lat]
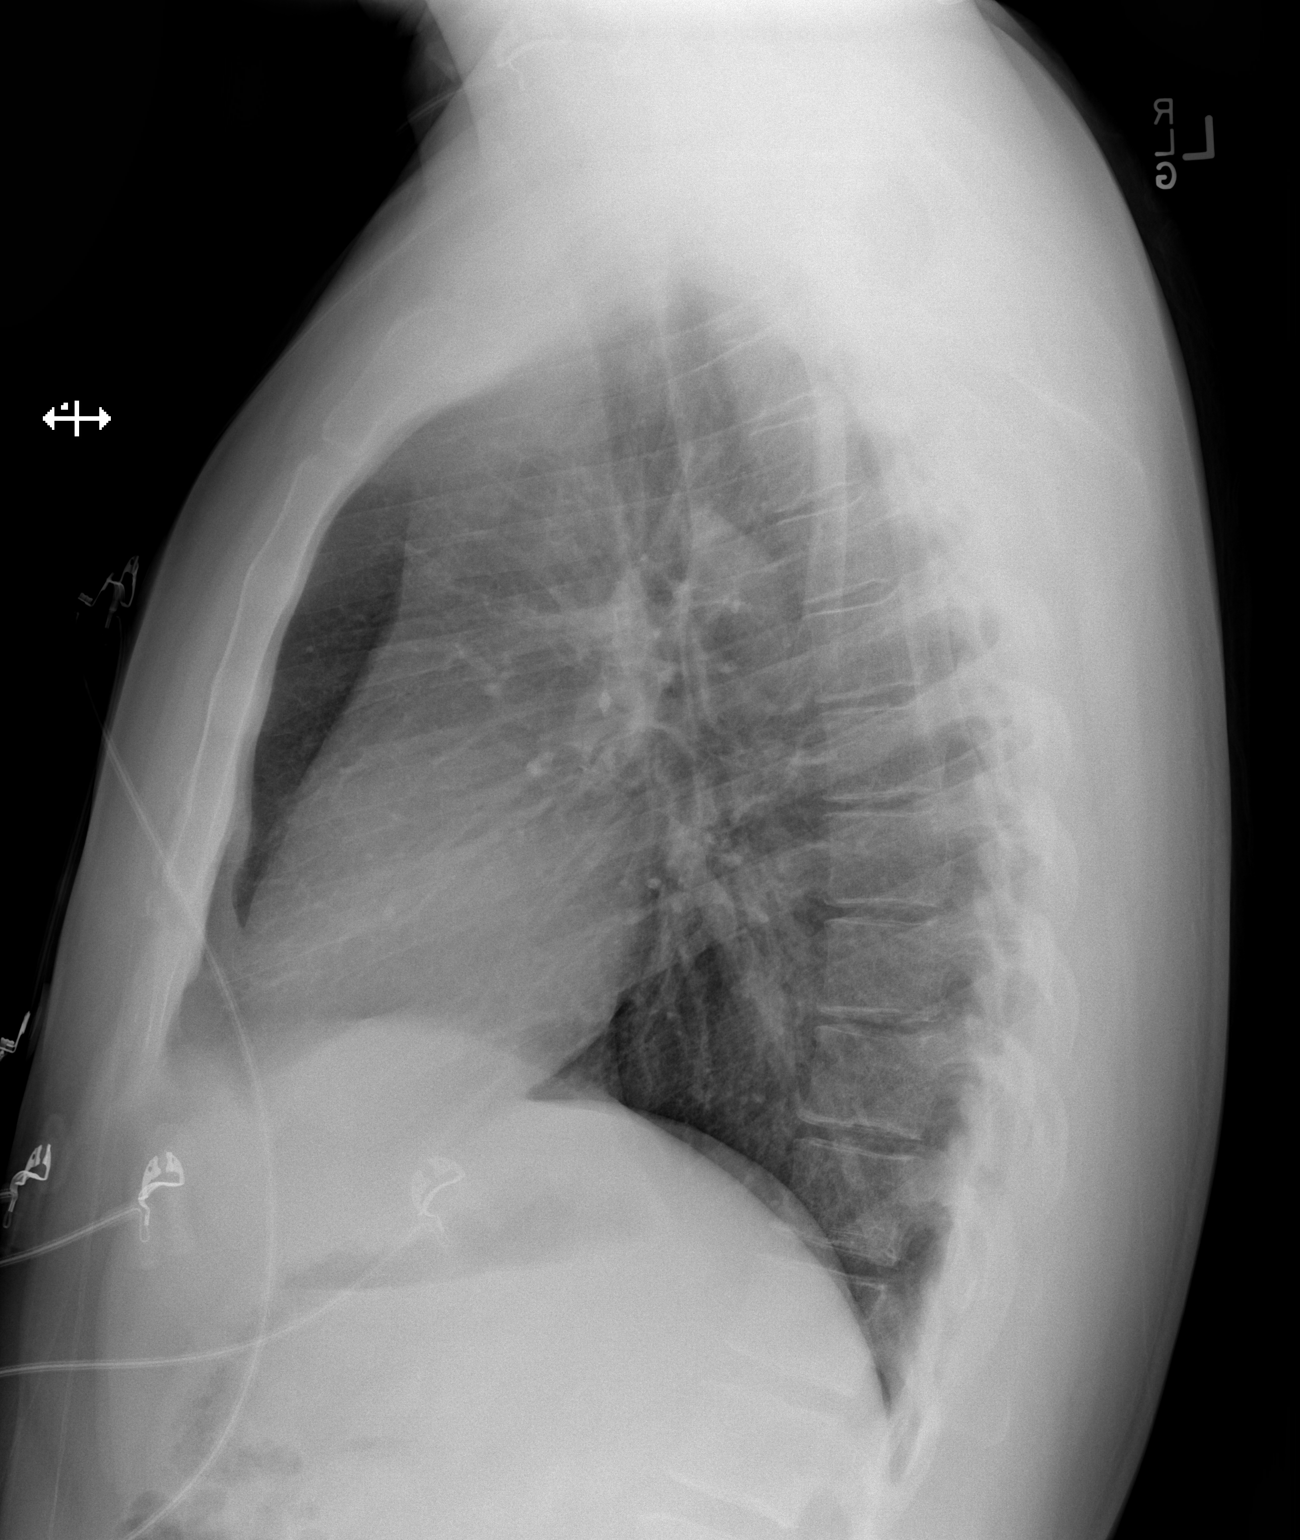

[2 of 2 positions shown; findings below may reference images not displayed]

FINDINGS: The heart size and mediastinal contours are within normal limits.
Both lungs are clear. The visualized skeletal structures are
unremarkable.
IMPRESSION: No active cardiopulmonary disease.

## 2022-07-23 ENCOUNTER — Telehealth: Payer: Self-pay

## 2022-07-23 NOTE — Telephone Encounter (Signed)
No working #. AS, CMA 

## 2022-08-26 ENCOUNTER — Encounter: Payer: Self-pay | Admitting: Family

## 2022-08-26 DIAGNOSIS — F319 Bipolar disorder, unspecified: Secondary | ICD-10-CM | POA: Insufficient documentation

## 2022-08-26 DIAGNOSIS — F909 Attention-deficit hyperactivity disorder, unspecified type: Secondary | ICD-10-CM | POA: Insufficient documentation

## 2022-11-05 ENCOUNTER — Ambulatory Visit: Payer: Medicaid Other | Admitting: Family

## 2023-03-23 ENCOUNTER — Other Ambulatory Visit: Payer: Self-pay | Admitting: Family

## 2023-03-29 ENCOUNTER — Other Ambulatory Visit: Payer: Self-pay | Admitting: Family

## 2023-10-14 ENCOUNTER — Ambulatory Visit
Admission: EM | Admit: 2023-10-14 | Discharge: 2023-10-14 | Disposition: A | Discharge status: Home / Self Care | Attending: Family Medicine | Admitting: Family Medicine

## 2023-10-14 NOTE — ED Notes (Signed)
 Front desk staff states pt, felt like he will pass out, font desk asked pt to take a sit on the lobby on front of her and clinical staff will be soon with him and take him to the back, pt decided he need it to go outside and take a fresh air.

## 2023-10-14 NOTE — ED Notes (Signed)
 Called patient from the lobby and outside. No response.

## 2023-10-14 NOTE — ED Notes (Signed)
 Called patient from front lobby x 2. Patient came back with wife and 2 dogs stating that dogs were his service animals. We advised patient he cannot bring 2 animals back into triage. Patient stated yelling and storm out the door.

## 2023-10-20 ENCOUNTER — Emergency Department (HOSPITAL_COMMUNITY): Admission: EM | Admit: 2023-10-20 | Discharge: 2023-10-20 | Discharge status: Against Medical Advice

## 2023-10-20 NOTE — ED Notes (Signed)
 Pt left before triage.
